# Patient Record
Sex: Female | Born: 1985 | Race: White | Hispanic: No | Marital: Single | Smoking: Current every day smoker
Health system: Southern US, Community
[De-identification: ages and names within clinical notes are randomized; demographics above are authoritative.]

## PROBLEM LIST (undated history)

## (undated) DIAGNOSIS — E039 Hypothyroidism, unspecified: Secondary | ICD-10-CM

## (undated) HISTORY — PX: OTHER SURGICAL HISTORY: SHX169

## (undated) HISTORY — PX: HIP SURGERY: SHX245

## (undated) HISTORY — PX: ADENOIDECTOMY AND MYRINGOTOMY WITH TUBE PLACEMENT: SHX5714

## (undated) HISTORY — PX: KNEE SURGERY: SHX244

---
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
## 2006-05-19 ENCOUNTER — Emergency Department (HOSPITAL_COMMUNITY): Admission: EM | Admit: 2006-05-19 | Discharge: 2006-05-20 | Payer: Self-pay | Admitting: Emergency Medicine

## 2006-08-09 ENCOUNTER — Emergency Department (HOSPITAL_COMMUNITY): Admission: EM | Admit: 2006-08-09 | Discharge: 2006-08-09 | Payer: Self-pay | Admitting: Emergency Medicine

## 2006-11-16 ENCOUNTER — Emergency Department (HOSPITAL_COMMUNITY): Admission: EM | Admit: 2006-11-16 | Discharge: 2006-11-16 | Payer: Self-pay | Admitting: Emergency Medicine

## 2006-11-23 ENCOUNTER — Emergency Department (HOSPITAL_COMMUNITY): Admission: EM | Admit: 2006-11-23 | Discharge: 2006-11-23 | Payer: Self-pay | Admitting: Emergency Medicine

## 2007-02-22 ENCOUNTER — Emergency Department (HOSPITAL_COMMUNITY): Admission: EM | Admit: 2007-02-22 | Discharge: 2007-02-23 | Payer: Self-pay | Admitting: Emergency Medicine

## 2007-04-26 ENCOUNTER — Emergency Department (HOSPITAL_COMMUNITY): Admission: EM | Admit: 2007-04-26 | Discharge: 2007-04-27 | Payer: Self-pay | Admitting: Emergency Medicine

## 2007-05-25 ENCOUNTER — Emergency Department (HOSPITAL_COMMUNITY): Admission: EM | Admit: 2007-05-25 | Discharge: 2007-05-25 | Payer: Self-pay | Admitting: Emergency Medicine

## 2008-06-30 ENCOUNTER — Emergency Department (HOSPITAL_COMMUNITY): Admission: EM | Admit: 2008-06-30 | Discharge: 2008-06-30 | Payer: Self-pay | Admitting: Emergency Medicine

## 2009-02-17 ENCOUNTER — Emergency Department (HOSPITAL_COMMUNITY): Admission: EM | Admit: 2009-02-17 | Discharge: 2009-02-18 | Payer: Self-pay | Admitting: Emergency Medicine

## 2011-02-24 ENCOUNTER — Emergency Department (HOSPITAL_COMMUNITY)
Admission: EM | Admit: 2011-02-24 | Discharge: 2011-02-25 | Disposition: A | Discharge status: Home / Self Care | Payer: Self-pay | Attending: Emergency Medicine | Admitting: Emergency Medicine

## 2011-02-24 DIAGNOSIS — R197 Diarrhea, unspecified: Secondary | ICD-10-CM | POA: Insufficient documentation

## 2011-02-24 DIAGNOSIS — E860 Dehydration: Secondary | ICD-10-CM | POA: Insufficient documentation

## 2011-02-24 DIAGNOSIS — K5289 Other specified noninfective gastroenteritis and colitis: Secondary | ICD-10-CM | POA: Insufficient documentation

## 2011-02-24 DIAGNOSIS — R112 Nausea with vomiting, unspecified: Secondary | ICD-10-CM | POA: Insufficient documentation

## 2011-02-24 LAB — BASIC METABOLIC PANEL
BUN: 13 mg/dL (ref 6–23)
CO2: 22 mEq/L (ref 19–32)
Calcium: 9.6 mg/dL (ref 8.4–10.5)
Chloride: 102 mEq/L (ref 96–112)
Creatinine, Ser: 0.89 mg/dL (ref 0.4–1.2)
GFR calc Af Amer: >60 mL/min (ref >60)
GFR calc non Af Amer: >60 mL/min (ref >60)
Glucose, Bld: 139 mg/dL — ABNORMAL HIGH (ref 70–99)
Potassium: 3.9 mEq/L (ref 3.5–5.1)
Sodium: 137 mEq/L (ref 135–145)

## 2011-02-24 LAB — DIFFERENTIAL
Basophils Relative: 0 % (ref 0–1)
Lymphs Abs: 0.5 10*3/uL — ABNORMAL LOW (ref 0.7–4.0)
Monocytes Relative: 2 % — ABNORMAL LOW (ref 3–12)
Neutro Abs: 14.3 10*3/uL — ABNORMAL HIGH (ref 1.7–7.7)
Neutrophils Relative %: 95 % — ABNORMAL HIGH (ref 43–77)

## 2011-02-24 LAB — CBC
Hemoglobin: 13.4 g/dL (ref 12.0–15.0)
MCH: 27.3 pg (ref 26.0–34.0)
RBC: 4.91 MIL/uL (ref 3.87–5.11)
WBC: 15.1 10*3/uL — ABNORMAL HIGH (ref 4.0–10.5)

## 2011-05-17 NOTE — Op Note (Signed)
NAME:  Michele Fowler, Michele Fowler                         ACCOUNT NO.:  [id number]   MEDICAL RECORD NO.:  [id number]                   PATIENT TYPE:  OIB   LOCATION:  2550                                 FACILITY:  [facility]   PHYSICIAN:  Burnard Bunting, M.D.                 DATE OF BIRTH:  1986-04-09   DATE OF PROCEDURE:  09/13/2003  DATE OF DISCHARGE:                                 OPERATIVE REPORT   PREOPERATIVE DIAGNOSIS:  Right knee anterior cruciate ligament tear.   POSTOPERATIVE DIAGNOSIS:  Right knee anterior cruciate ligament tear and  lateral meniscal tears.   PROCEDURE:  Right knee anterior cruciate ligament reconstruction, bone-  patellar-tendon-bone and partial lateral meniscectomy.   SURGEON ATTENDING:  Burnard Bunting, M.D.   ASSISTANT:  Jerolyn Shin. Tresa Res, M.D.   ANESTHESIA:  General anesthesia plus preop femoral nerve block plus postop  intra-articular injection of clonidine, Marcaine and morphine.   OPERATIVE FINDINGS:  1. Examination under anesthesia:  Range of motion -- full extension and 130     degrees of flexion with 3 to 4 mm of opening to varus and valgus stress     at 30 degrees of flexion and about 2 mm of opening to varus and valgus     stress at 0 degrees of flexion, positive pivot shift and Lachman, PCL     intact, no posterolateral rotatory instability was noted.  2. Diagnostic and operative arthroscopy:  #1 - Intact patellofemoral joint.     #2 - Intact medial compartment articular cartilage with partial-thickness     tear, posterior horn of the medial meniscus, which was stable and less     than approximately 5 mm in length.  #3 - Torn ACL.  #4 - Intact PCL.  #5     - Intact articular cartilage of lateral compartment with partial-     thickness tear of the lateral meniscus in the red right junction over     about a 1-cm area at the level of the popliteus.   PROCEDURE IN DETAIL:  The patient was brought to the operating room where  general endotracheal  anesthesia was induced.  Preoperative IV antibiotics  were administered.  Right leg was examined under anesthesia and then prepped  with Duraprep solution and draped in a sterile manner.  A proximal thigh  tourniquet was placed.  The right leg was then prepped with Duraprep  solution and draped in a sterile manner.  Collier Flowers was used to cover the  operative field.  The leg was elevated and exsanguinated with the Esmarch  wrap.  The tourniquet was inflated.  An incision was made from the inferior  pole of the patella down into the tibial tubercle.  Skin and subcutaneous  tissue were sharply divided.  The peritenon was identified and divided first  as a separate layer for layered closure.  The bone-patellar-tendon-bone  block was harvested using  a double-wide 9-mm knife.  An oscillating saw was  used to harvest the bone plugs.  One #5 Ethibond was placed through the nose  of what should be the femoral bone plug.  Two #5 FiberWires were placed  through what would be the tibial bone plug.  Following harvest of the bone  plug, the patellar defect was bone-grafted.  The patellar tendon was loosely  reapproximated using 0 Vicryl suture.  Peritenon was partially closed from  proximal to halfway distal using 0 Vicryl suture.  At this time, the  periosteal elevator was used to elevate tissue off the proximal and medial  tibia where the anticipated location of the tibial tunnel would begin.  The  anterior inferolateral portal was created and anterior inferomedial portal  was then created under direct visualization.  Diagnostic arthroscopy was  performed.  The suprapatellar pouch was intact.  The patellofemoral  compartment was intact, no loose bodies in the mediolateral gutter.  Medial  compartment articular cartilage was intact.  Small partial-thickness tear of  the medial meniscus posterior horn was encountered; this was stable and did  not require debridement.  ACL was not intact, it was torn;  debridement was  performed of the intercondylar notch region and the over-the-top position  was identified.  The lateral compartment was inspected.  Partial-thickness  tear of the lateral meniscus at the level of the popliteus was identified;  this was stable and did not require debridement, however, there was an  unstable flap of meniscus emanating off the posterior horn of the lateral  meniscus which did require debridement with a shaver.  At this time, the  tibial guide was placed, set at 50 degrees.  A guidepin was then placed  through the posterior aspect of the native ACL footprint in the region of  the central artery of the ACL.  The guidepin was placed at a 60 degree  coronal angle.  A 9-mm reamer was then used to drill the tunnel.  A 2-mm off-  set guide was then placed at the junction of the roof and lateral wall of  the posterolateral femoral condyle.  The Beath pin was then placed.  The 9-  mm reamer was then taken to about 28 mm.  The graft was then passed through  the Beath pin into the knee.  The femoral fixation was achieved with a 7 x  28-mm bioabsorbable interference screw.  He was taken through a range of  motion and isometry was found to be good.  There was no graft impingement.  At this time, the tibial side was secured by tying the graft over a 6.5 x  25.0-mm cancellous screw.  Good graft tension was achieved.  The graft was  tied in about 15 degrees of flexion.  Again, there was no impingement, full  range of motion and stable graft following fixation.  At this time, the  tourniquet was released.  Total tourniquet time was 2 hours.  Thorough  irrigation of the joint was performed.  The graft tension was excellent  after taking the knee through a range of motion.  The portals were then  closed using 0 Vicryl suture.  Peritenon was closed using 0 Vicryl  interrupted simple sutures.  Skin and subcutaneous tissue were closed using interrupted and inverted 2-0 Vicryl  sutures followed by a running 3-0 pull-  out Prolene.  Steri-Strips were applied, bulky dressing, cold pack and knee  immobilizer were applied.  The patient tolerated the procedure well  without  immediate complications.                                               Burnard Bunting, M.D.    GSD/MEDQ  D:  09/13/2003  T:  09/13/2003  Job:  [id number]

## 2011-09-26 LAB — URINE MICROSCOPIC-ADD ON

## 2011-09-26 LAB — URINALYSIS, ROUTINE W REFLEX MICROSCOPIC
Bilirubin Urine: NEGATIVE
Nitrite: NEGATIVE
Specific Gravity, Urine: >1.03 — ABNORMAL HIGH
pH: 5

## 2011-09-26 LAB — PREGNANCY, URINE: Preg Test, Ur: NEGATIVE

## 2012-08-17 DIAGNOSIS — M25559 Pain in unspecified hip: Secondary | ICD-10-CM | POA: Insufficient documentation

## 2012-08-18 ENCOUNTER — Emergency Department (HOSPITAL_COMMUNITY)
Admission: EM | Admit: 2012-08-18 | Discharge: 2012-08-18 | Disposition: A | Discharge status: Home / Self Care | Payer: Self-pay | Attending: Emergency Medicine | Admitting: Emergency Medicine

## 2012-08-18 ENCOUNTER — Encounter (HOSPITAL_COMMUNITY): Payer: Self-pay

## 2012-08-18 ENCOUNTER — Emergency Department (HOSPITAL_COMMUNITY): Payer: Self-pay

## 2012-08-18 DIAGNOSIS — M25559 Pain in unspecified hip: Secondary | ICD-10-CM

## 2012-08-18 MED ORDER — HYDROCODONE-ACETAMINOPHEN 5-325 MG PO TABS: 1.0000 | ORAL_TABLET | ORAL | Status: AC | PRN

## 2012-08-18 MED ORDER — HYDROCODONE-ACETAMINOPHEN 5-325 MG PO TABS
1.0000 | ORAL_TABLET | Freq: Once | ORAL | Status: AC
  Administered 2012-08-18: 1 via ORAL
  Filled 2012-08-18: qty 1

## 2012-08-18 MED ORDER — IBUPROFEN 800 MG PO TABS
800.0000 mg | ORAL_TABLET | Freq: Once | ORAL | Status: AC
  Administered 2012-08-18: 800 mg via ORAL
  Filled 2012-08-18: qty 1

## 2012-08-18 NOTE — ED Notes (Signed)
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

## 2012-08-18 NOTE — ED Provider Notes (Signed)
History     CSN: [id number]  Arrival date & time 08/17/12  2356   First MD Initiated Contact with Patient 08/18/12 0143      Chief Complaint  Patient presents with  . Hip Pain    (Consider location/radiation/quality/duration/timing/severity/associated sxs/prior treatment) HPI  Michele Fowler is a 26 y.o. 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 Pain is sharp and occurs when walking. It has been present intermittently for several weeks. Tonight she was unable to get comfortable to go to sleep. She has taken occasional ibuprofen without relief.  History reviewed. No pertinent past medical history.  Past Surgical History  Procedure Date  . Hip surgery     No family history on file.  History  Substance Use Topics  . Smoking status: Never Smoker   . Smokeless tobacco: Not on file  . Alcohol Use: No    OB History    Grav Para Term Preterm Abortions TAB SAB Ect Mult Living                  Review of Systems  Constitutional: Negative for fever.       10 Systems reviewed and are negative for acute change except as noted in the HPI.  HENT: Negative for congestion.   Eyes: Negative for discharge and redness.  Respiratory: Negative for cough and shortness of breath.   Cardiovascular: Negative for chest pain.  Gastrointestinal: Negative for vomiting and abdominal pain.  Musculoskeletal: Negative for back pain.       Right hip pain  Skin: Negative for rash.  Neurological: Negative for syncope, numbness and headaches.  Psychiatric/Behavioral:       No behavior change.    Allergies  Codeine  Home Medications   Current Outpatient Rx  Name Route Sig Dispense Refill  . IBUPROFEN 800 MG PO TABS Oral Take 800 mg by mouth every 8 (eight) hours as needed.    Marland Kitchen HYDROCODONE-ACETAMINOPHEN 5-325 MG PO TABS Oral Take 1 tablet by mouth every 4 (four) hours  as needed for pain. 15 tablet 0    BP 104/63  Pulse 83  Temp 98.3 F (36.8 C) (Oral)  Resp 16  Ht 4\' 9"  (1.448 m)  Wt 160 lb (72.576 kg)  BMI 34.62 kg/m2  SpO2 98%  LMP 08/16/2012  Physical Exam  Nursing note and vitals reviewed. Constitutional:       Awake, alert, nontoxic appearance.  HENT:  Head: Atraumatic.  Eyes: Right eye exhibits no discharge. Left eye exhibits no discharge.  Neck: Neck supple.  Pulmonary/Chest: Effort normal. She exhibits no tenderness.  Abdominal: Soft. There is no tenderness. There is no rebound.  Musculoskeletal: Normal range of motion. She exhibits no tenderness.       Baseline ROM, no obvious new focal weakness.FROM at both hips. No deformity, erythema, crepitus. No tenderness with palpation.   Neurological:       Mental status and motor strength appears baseline for patient and situation.  Skin: No rash noted.  Psychiatric: She has a normal mood and affect.    ED Course  Procedures (including critical care time)  Labs Reviewed - No data to display Dg Hip Complete Right  08/18/2012  *RADIOLOGY REPORT*  Clinical Data: Right hip pain.  No injury.  RIGHT HIP - COMPLETE 2+ VIEW  Comparison: 05/05/2005  Findings: Hypoplastic appearance of the left femoral head  with incomplete coverage of both femoral heads consistent with congenital change.  No evidence of acute fracture or subluxation. No focal bone lesion or bone destruction.  Bone cortex and trabecular architecture appear intact.  The visualized pelvis, sacrum, SI joints, and symphysis pubis appear intact.  No significant change since previous study.  IMPRESSION: Probable congenital deformities of the hips.  No evidence of acute fracture or subluxation.   Original Report Authenticated By: Marlon Pel, M.D.      1. Hip pain       MDM  Patient with hip pain and no acute findings on xray. GIven antiinflammatory and analgesic with some relief. Referral to orthopedist.  Pt feels improved  after observation and/or treatment in ED.Pt stable in ED with no significant deterioration in condition.The patient appears reasonably screened and/or stabilized for discharge and I doubt any other medical condition or other [facility] requiring further screening, evaluation, or treatment in the ED at this time prior to discharge.  MDM Reviewed: nursing note and vitals Interpretation: x-ray           Nicoletta Dress. Colon Branch, MD 08/18/12 2121

## 2014-01-23 ENCOUNTER — Emergency Department (HOSPITAL_COMMUNITY): Payer: Self-pay

## 2014-01-23 ENCOUNTER — Emergency Department (HOSPITAL_COMMUNITY)
Admission: EM | Admit: 2014-01-23 | Discharge: 2014-01-23 | Disposition: A | Discharge status: Home / Self Care | Payer: Self-pay | Attending: Emergency Medicine | Admitting: Emergency Medicine

## 2014-01-23 ENCOUNTER — Encounter (HOSPITAL_COMMUNITY): Payer: Self-pay | Admitting: Emergency Medicine

## 2014-01-23 DIAGNOSIS — Y9352 Activity, horseback riding: Secondary | ICD-10-CM | POA: Insufficient documentation

## 2014-01-23 DIAGNOSIS — Z9889 Other specified postprocedural states: Secondary | ICD-10-CM | POA: Insufficient documentation

## 2014-01-23 DIAGNOSIS — S82409A Unspecified fracture of shaft of unspecified fibula, initial encounter for closed fracture: Secondary | ICD-10-CM | POA: Insufficient documentation

## 2014-01-23 DIAGNOSIS — Z79899 Other long term (current) drug therapy: Secondary | ICD-10-CM | POA: Insufficient documentation

## 2014-01-23 DIAGNOSIS — S82301A Unspecified fracture of lower end of right tibia, initial encounter for closed fracture: Secondary | ICD-10-CM

## 2014-01-23 DIAGNOSIS — S82899A Other fracture of unspecified lower leg, initial encounter for closed fracture: Secondary | ICD-10-CM | POA: Insufficient documentation

## 2014-01-23 DIAGNOSIS — S82831A Other fracture of upper and lower end of right fibula, initial encounter for closed fracture: Secondary | ICD-10-CM

## 2014-01-23 MED ORDER — MORPHINE SULFATE 4 MG/ML IJ SOLN
4.0000 mg | Freq: Once | INTRAMUSCULAR | Status: AC
  Administered 2014-01-23: 4 mg via INTRAVENOUS
  Filled 2014-01-23: qty 1

## 2014-01-23 MED ORDER — OXYCODONE-ACETAMINOPHEN 5-325 MG PO TABS: 1.0000 | ORAL_TABLET | ORAL | Status: DC | PRN

## 2014-01-23 NOTE — ED Notes (Signed)
Pt was riding a horse when the horse flipped over on pt landing on right lower leg, pt c/o pain and swelling to right lower leg, positive distal pulse noted, good cap refill, deformity , ice pack applied,

## 2014-01-23 NOTE — ED Provider Notes (Signed)
CSN: [id number]     Arrival date & time 01/23/14  1620 History   First MD Initiated Contact with Patient 01/23/14 1810     Chief Complaint  Patient presents with  . Leg Pain    Patient is a 28 y.o. female presenting with leg pain. The history is provided by the patient.  Leg Pain Location:  Ankle Time since incident:  3 hours Ankle location:  R ankle Pain details:    Quality:  Aching   Radiates to:  Does not radiate   Severity:  Moderate   Onset quality:  Sudden   Timing:  Constant   Progression:  Unchanged Chronicity:  New Relieved by:  Rest Exacerbated by: movement. Associated symptoms: no back pain   pt was riding a horse The horse went up on its hind legs and fell on top of her She was not helmeted She denies Head injury.  No HA.  No LOC.  No neck or back pain.  No cp/sob No abd pain She reports pain in right leg/ankle only   History reviewed. No pertinent past medical history. Past Surgical History  Procedure Laterality Date  . Hip surgery    . Knee surgery     No family history on file. History  Substance Use Topics  . Smoking status: Never Smoker   . Smokeless tobacco: Not on file  . Alcohol Use: No   OB History   Grav Para Term Preterm Abortions TAB SAB Ect Mult Living                 Review of Systems  Respiratory: Negative for shortness of breath.   Cardiovascular: Negative for chest pain.  Gastrointestinal: Negative for vomiting.  Musculoskeletal: Negative for back pain.  Neurological: Negative for weakness.  All other systems reviewed and are negative.    Allergies  Codeine  Home Medications   Current Outpatient Rx  Name  Route  Sig  Dispense  Refill  . levothyroxine (SYNTHROID, LEVOTHROID) 25 MCG tablet   Oral   Take 25 mcg by mouth daily before breakfast.          BP 139/99  Pulse 98  Temp(Src) 98.4 F (36.9 C) (Oral)  Resp 20  Ht _0  (1.448 m)  Wt 165 lb (74.844 kg)  BMI 35.70 kg/m2  SpO2 98%  LMP 12/26/2013 Physical  Exam CONSTITUTIONAL: Well developed/well nourished HEAD: Normocephalic/atraumatic EYES: EOMI/PERRL ENMT: Mucous membranes moist, No evidence of facial/nasal trauma NECK: supple no meningeal signs SPINE:entire spine nontender, No bruising/crepitance/stepoffs noted to spine NEXUS Criteria met (she is not distracted during exam) CV: S1/S2 noted, no murmurs/rubs/gallops noted LUNGS: Lungs are clear to auscultation bilaterally, no apparent distress Chest - no tenderness noted to chest wall ABDOMEN: soft, nontender, no rebound or guarding, no bruising GU:no cva tenderness NEURO: Pt is awake/alert, moves all extremitiesx4, GCS 15.  She is able to move toes on right foot without difficulty EXTREMITIES: pulses normal/equal in both feet.  She has tenderness and deformity to right ankle.  No lacerations are noted.  No right prox fib tenderness.  Pelvis stable.   SKIN: warm, color normal PSYCH: no abnormalities of mood noted  ED Course  Procedures (including critical care time) Labs Review Labs Reviewed - No data to display Imaging Review Dg Tibia/fibula Right  01/23/2014   CLINICAL DATA:  Right lower leg pain after being stepped on by a horse today.  EXAM: RIGHT TIBIA AND FIBULA - 2 VIEW  COMPARISON:  None.  FINDINGS: There is a fracture of the distal fibular shaft and of the medial malleolus of the distal tibia. There is a screw in the proximal tibial metaphysis, probably related to prior ACL repair. There is widening of the medial joint space at the ankle.  IMPRESSION: Fractures of the distal tibia and fibula.   Electronically Signed   By: Rozetta Nunnery M.D.   On: 01/23/2014 17:43   Dg Ankle Complete Right  01/23/2014   CLINICAL DATA:  Leg pain after being stepped on by a horse today.  EXAM: RIGHT ANKLE - COMPLETE 3+ VIEW  COMPARISON:  None.  FINDINGS: There is spiral fracture of the distal fibular shaft 7 cm above the tip of the lateral malleolus with slight lateral displacement of the distal  fragment. There is widening of the distal tibiofibular space suggesting injury to the syndesmosis. There is a transverse slightly distracted fracture of the medial malleolus with anterior displacement of the distal fragment. There is widening of the medial joint space at the ankle.  IMPRESSION: 1. Spiral fracture of the distal fibular shaft. 2. Slightly displaced fracture of the medial malleolus. 3. Widening of the distal tibiofibular space consistent with disruption of the syndesmosis.   Electronically Signed   By: Rozetta Nunnery M.D.   On: 01/23/2014 17:42    EKG Interpretation   None       MDM  No diagnosis found. Nursing notes including past medical history and social history reviewed and considered in documentation xrays reviewed and considered  7:04 PM Dr Aline Brochure has reviewed imaging He requests splint and will see in office tomorrow at 9am She has no other signs of traumatic injury at this time   At discharge, pt ambulatory, denies any new complaints, she is neurovascularly intact after splint placement She will see ortho tomorrow   Sharyon Cable, MD 01/23/14 2051

## 2014-01-23 NOTE — Discharge Instructions (Signed)
Ankle Fracture °A fracture is a break in the bone. A cast or splint is used to protect and keep your injured bone from moving.  °HOME CARE INSTRUCTIONS  °· Use your crutches as directed. °· To lessen the swelling, keep the injured leg elevated while sitting or lying down. °· Apply ice to the injury for 15-20 minutes, 03-04 times per day while awake for 2 days. Put the ice in a plastic bag and place a thin towel between the bag of ice and your cast. °· If you have a plaster or fiberglass cast: °· Do not try to scratch the skin under the cast using sharp or pointed objects. °· Check the skin around the cast every day. You may put lotion on any red or sore areas. °· Keep your cast dry and clean. °· If you have a plaster splint: °· Wear the splint as directed. °· You may loosen the elastic around the splint if your toes become numb, tingle, or turn cold or blue. °· Do not put pressure on any part of your cast or splint; it may break. Rest your cast only on a pillow the first 24 hours until it is fully hardened. °· Your cast or splint can be protected during bathing with a plastic bag. Do not lower the cast or splint into water. °· Take medications as directed by your caregiver. Only take over-the-counter or prescription medicines for pain, discomfort, or fever as directed by your caregiver. °· Do not drive a vehicle until your caregiver specifically tells you it is safe to do so. °· If your caregiver has given you a follow-up appointment, it is very important to keep that appointment. Not keeping the appointment could result in a chronic or permanent injury, pain, and disability. If there is any problem keeping the appointment, you must call back to this facility for assistance. °SEEK IMMEDIATE MEDICAL CARE IF:  °· Your cast gets damaged or breaks. °· You have continued severe pain or more swelling than you did before the cast was put on. °· Your skin or toenails below the injury turn blue or gray, or feel cold or  numb. °· There is a bad smell or new stains and/or purulent (pus like) drainage coming from under the cast. °If you do not have a window in your cast for observing the wound, a discharge or minor bleeding may show up as a stain on the outside of your cast. Report these findings to your caregiver. °MAKE SURE YOU:  °· Understand these instructions. °· Will watch your condition. °· Will get help right away if you are not doing well or get worse. °Document Released: 12/13/2000 Document Revised: 03/09/2012 Document Reviewed: 07/15/2013 °[employer]® Patient Information ©2014 [employer]. ° °Cast or Splint Care °Casts and splints support injured limbs and keep bones from moving while they heal.  °HOME CARE °· Keep the cast or splint uncovered during the drying period. °· A plaster cast can take 24 to 48 hours to dry. °· A fiberglass cast will dry in less than 1 hour. °· Do not rest the cast on anything harder than a pillow for 24 hours. °· Do not put weight on your injured limb. Do not put pressure on the cast. Wait for your doctor's approval. °· Keep the cast or splint dry. °· Cover the cast or splint with a plastic bag during baths or wet weather. °· If you have a cast over your chest and belly (trunk), take sponge baths until the cast   is taken off. °· If your cast gets wet, dry it with a towel or blow dryer. Use the cool setting on the blow dryer. °· Keep your cast or splint clean. Wash a dirty cast with a damp cloth. °· Do not put any objects under your cast or splint. °· Do not scratch the skin under the cast with an object. If itching is a problem, use a blow dryer on a cool setting over the itchy area. °· Do not trim or cut your cast. °· Do not take out the padding from inside your cast. °· Exercise your joints near the cast as told by your doctor. °· Raise (elevate) your injured limb on 1 or 2 pillows for the first 1 to 3 days. °GET HELP IF: °· Your cast or splint cracks. °· Your cast or splint is too tight or too  loose. °· You itch badly under the cast. °· Your cast gets wet or has a soft spot. °· You have a bad smell coming from the cast. °· You get an object stuck under the cast. °· Your skin around the cast becomes red or sore. °· You have new or more pain after the cast is put on. °GET HELP RIGHT AWAY IF: °· You have fluid leaking through the cast. °· You cannot move your fingers or toes. °· Your fingers or toes turn blue or white or are cool, painful, or puffy (swollen). °· You have tingling or lose feeling (numbness) around the injured area. °· You have bad pain or pressure under the cast. °· You have trouble breathing or have shortness of breath. °· You have chest pain. °Document Released: 04/17/2011 Document Revised: 08/18/2013 Document Reviewed: 06/24/2013 °Exit[employer]® Patient Information ©2014 [employer]. ° °

## 2014-01-23 NOTE — ED Notes (Signed)
Patient ambulated to the bathroom with crutches. Had no issues and was comfortable ambulating. JGW

## 2014-01-24 ENCOUNTER — Ambulatory Visit (INDEPENDENT_AMBULATORY_CARE_PROVIDER_SITE_OTHER): Payer: Self-pay | Admitting: Orthopedic Surgery

## 2014-01-24 ENCOUNTER — Encounter: Payer: Self-pay | Admitting: Orthopedic Surgery

## 2014-01-24 VITALS — BP 119/82 | Ht <= 58 in | Wt 160.0 lb

## 2014-01-24 DIAGNOSIS — S82843A Displaced bimalleolar fracture of unspecified lower leg, initial encounter for closed fracture: Secondary | ICD-10-CM

## 2014-01-24 NOTE — Progress Notes (Signed)
Patient ID: Michele Fowler, female   DOB: 1986/04/19, 28 y.o.   MRN: [id number] Chief Complaint  Patient presents with  . Ankle Injury    Right ankle fracture. DOI 01-23-14.    HISTORY: 28 year old female who was thrown from her horse landed on her right leg her horse may have actually stepped on her she sustained a bimalleolar closed right ankle fracture. She was injured on January 25 and seen in the emergency room on the same day. She was splinted and sent to the office after a conversation with the ERP. She complains of sharp throbbing 10 out of 10 constant pain with tingling in the foot and swelling.  Her review of systems is positive for swelling and tingling the other review of systems normal. She's allergic to codeine. She has no major medical problems.  She's had bilateral hip surgery and a right anterior cruciate ligament reconstruction. She's on Synthroid medication. Her family history is positive for arthritis cancer and diabetes  Social history single  Her smoking history is positive for half pack per day she doesn't drink she completed her education through the 11th grade    History reviewed. No pertinent past medical history. Past Surgical History  Procedure Laterality Date  . Hip surgery    . Knee surgery     History reviewed. No pertinent family history.   The past, family history and social history have been reviewed and are recorded in the corresponding sections of epic   BP 119/82  Ht 4\' 9"  (1.448 m)  Wt 160 lb (72.576 kg)  BMI 34.61 kg/m2  LMP 12/26/2013 General appearance is normal, the patient is alert and oriented x3 with normal mood and affect. Her body habitus is endomorphic  She is ambulatory with crutches nonweightbearing. Her upper extremities have normal range of motion stability strength and skin. Pulse temperature normal without edema there is no lymphadenopathy there is normal sensation no pathologic reflexes  The left lower extremity is  normal  The right lower extremity swollen and tender on both medial and lateral sides of the ankle with painful range of motion and unstable fracture pattern is noted on x-ray muscle tone is normal the skin is intact she has good pulses warm foot no obvious sensory deficits and no pathologic reflexes  X-rays show bimalleolar fracture Weber C. type injury with a high fibular fracture approximately half way up the fibula if not just a little below that with a displaced medial malleolus and some widening of the mortise  Impression closed bimalleolar ankle fracture right lower extremity  Recommend open treatment internal fixation with bimalleolar plating  The risks and benefits of the procedure have been described to the patient she accepts those risks and she was offered alternative treatment of casting with a poor result likely.  She will have the open treatment internal fixation of the right ankle on Wednesday, January 28

## 2014-01-25 ENCOUNTER — Encounter (HOSPITAL_COMMUNITY): Payer: Self-pay

## 2014-01-25 ENCOUNTER — Encounter (HOSPITAL_COMMUNITY)
Admission: RE | Admit: 2014-01-25 | Discharge: 2014-01-25 | Disposition: A | Discharge status: Home / Self Care | Payer: Self-pay | Source: Ambulatory Visit | Attending: Orthopedic Surgery | Admitting: Orthopedic Surgery

## 2014-01-25 ENCOUNTER — Encounter (HOSPITAL_COMMUNITY): Payer: Self-pay | Admitting: Pharmacy Technician

## 2014-01-25 DIAGNOSIS — Z01812 Encounter for preprocedural laboratory examination: Secondary | ICD-10-CM | POA: Insufficient documentation

## 2014-01-25 DIAGNOSIS — Z01818 Encounter for other preprocedural examination: Secondary | ICD-10-CM | POA: Insufficient documentation

## 2014-01-25 HISTORY — DX: Hypothyroidism, unspecified: E03.9

## 2014-01-25 LAB — HCG, SERUM, QUALITATIVE: Preg, Serum: NEGATIVE

## 2014-01-25 LAB — HEMOGLOBIN AND HEMATOCRIT, BLOOD
HCT: 40.8 % (ref 36.0–46.0)
HEMOGLOBIN: 13.7 g/dL (ref 12.0–15.0)

## 2014-01-25 NOTE — Patient Instructions (Signed)
Gerlene BurdockSherry N Giancola  01/25/2014   Your procedure is scheduled on:  01/26/2014  [facility] at  1130 AM.  Call this number if you have problems the morning of surgery: [phone]   Remember:   Do not eat food or drink liquids after midnight.   Take these medicines the morning of surgery with A SIP OF WATER: synthroid, oxycodone   Do not wear jewelry, make-up or nail polish.  Do not wear lotions, powders, or perfumes.   Do not shave 48 hours prior to surgery. Men may shave face and neck.  Do not bring valuables to the hospital.  [facility] is not [facility] any belongings or valuables.               Contacts, dentures or bridgework may not be worn into surgery.  Leave suitcase in the car. After surgery it may be brought to your room.  For patients admitted to the hospital, discharge time is determined by your treatment team.               Patients discharged the day of surgery will not be allowed to drive home.  Name and phone number of your driver: family  Special Instructions: Shower using CHG 2 nights before surgery and the night before surgery.  If you shower the day of surgery use CHG.  Use special wash - you have one bottle of CHG for all showers.  You should use approximately 1/3 of the bottle for each shower.   Please read over the following fact sheets that you were given: Pain Booklet, Coughing and Deep Breathing, Surgical Site Infection Prevention, Anesthesia Post-op Instructions and Care and Recovery After Surgery Ankle Fracture A fracture is a break in the bone. A cast or splint is used to protect and keep your injured bone from moving.  HOME CARE INSTRUCTIONS   Use your crutches as directed.  To lessen the swelling, keep the injured leg elevated while sitting or lying down.  Apply ice to the injury for 15-20 minutes, 03-04 times per day while awake for 2 days. Put the ice in a plastic bag and place a thin towel between the bag of ice and your  cast.  If you have a plaster or fiberglass cast:  Do not try to scratch the skin under the cast using sharp or pointed objects.  Check the skin around the cast every day. You may put lotion on any red or sore areas.  Keep your cast dry and clean.  If you have a plaster splint:  Wear the splint as directed.  You may loosen the elastic around the splint if your toes become numb, tingle, or turn cold or blue.  Do not put pressure on any part of your cast or splint; it may break. Rest your cast only on a pillow the first 24 hours until it is fully hardened.  Your cast or splint can be protected during bathing with a plastic bag. Do not lower the cast or splint into water.  Take medications as directed by your caregiver. Only take over-the-counter or prescription medicines for pain, discomfort, or fever as directed by your caregiver.  Do not drive a vehicle until your caregiver specifically tells you it is safe to do so.  If your caregiver has given you a follow-up appointment, it is very important to keep that appointment. Not keeping the appointment could result in a chronic or permanent injury, pain, and disability.  If there is any problem keeping the appointment, you must call back to this facility for assistance. SEEK IMMEDIATE MEDICAL CARE IF:   Your cast gets damaged or breaks.  You have continued severe pain or more swelling than you did before the cast was put on.  Your skin or toenails below the injury turn blue or gray, or feel cold or numb.  There is a bad smell or new stains and/or purulent (pus like) drainage coming from under the cast. If you do not have a window in your cast for observing the wound, a discharge or minor bleeding may show up as a stain on the outside of your cast. Report these findings to your caregiver. MAKE SURE YOU:   Understand these instructions.  Will watch your condition.  Will get help right away if you are not doing well or get  worse. Document Released: 12/13/2000 Document Revised: 03/09/2012 Document Reviewed: 07/15/2013 [facility] Patient Information 2014 [place], [place]. PATIENT INSTRUCTIONS POST-ANESTHESIA  IMMEDIATELY FOLLOWING SURGERY:  Do not drive or operate machinery for the first twenty four hours after surgery.  Do not make any important decisions for twenty four hours after surgery or while taking narcotic pain medications or sedatives.  If you develop intractable nausea and vomiting or a severe headache please notify your doctor immediately.  FOLLOW-UP:  Please make an appointment with your surgeon as instructed. You do not need to follow up with anesthesia unless specifically instructed to do so.  WOUND CARE INSTRUCTIONS (if applicable):  Keep a dry clean dressing on the anesthesia/puncture wound site if there is drainage.  Once the wound has quit draining you may leave it open to air.  Generally you should leave the bandage intact for twenty four hours unless there is drainage.  If the epidural site drains for more than 36-48 hours please call the anesthesia department.  QUESTIONS?:  Please feel free to call your physician or the hospital operator if you have any questions, and they will be happy to assist you.

## 2014-01-26 ENCOUNTER — Ambulatory Visit (HOSPITAL_COMMUNITY): Payer: Self-pay | Admitting: Anesthesiology

## 2014-01-26 ENCOUNTER — Ambulatory Visit (HOSPITAL_COMMUNITY)
Admission: RE | Admit: 2014-01-26 | Discharge: 2014-01-26 | Disposition: A | Discharge status: Home / Self Care | Payer: Self-pay | Source: Ambulatory Visit | Attending: Orthopedic Surgery | Admitting: Orthopedic Surgery

## 2014-01-26 ENCOUNTER — Encounter (HOSPITAL_COMMUNITY)
Admission: RE | Disposition: A | Discharge status: Home / Self Care | Payer: Self-pay | Source: Ambulatory Visit | Attending: Orthopedic Surgery

## 2014-01-26 ENCOUNTER — Encounter (HOSPITAL_COMMUNITY): Payer: Self-pay | Admitting: *Deleted

## 2014-01-26 ENCOUNTER — Ambulatory Visit (HOSPITAL_COMMUNITY): Payer: Self-pay

## 2014-01-26 ENCOUNTER — Encounter (HOSPITAL_COMMUNITY): Payer: Self-pay | Admitting: Anesthesiology

## 2014-01-26 DIAGNOSIS — S82843A Displaced bimalleolar fracture of unspecified lower leg, initial encounter for closed fracture: Secondary | ICD-10-CM | POA: Insufficient documentation

## 2014-01-26 HISTORY — PX: ORIF ANKLE FRACTURE: SHX5408

## 2014-01-26 SURGERY — OPEN REDUCTION INTERNAL FIXATION (ORIF) ANKLE FRACTURE
Anesthesia: General | Site: Ankle | Laterality: Right

## 2014-01-26 MED ORDER — CEFAZOLIN SODIUM-DEXTROSE 2-3 GM-% IV SOLR
2.0000 g | INTRAVENOUS | Status: AC
  Administered 2014-01-26: 2 g via INTRAVENOUS

## 2014-01-26 MED ORDER — ROCURONIUM BROMIDE 50 MG/5ML IV SOLN
INTRAVENOUS | Status: AC
  Filled 2014-01-26: qty 1

## 2014-01-26 MED ORDER — CHLORHEXIDINE GLUCONATE 4 % EX LIQD: 60.0000 mL | Freq: Once | CUTANEOUS | Status: DC

## 2014-01-26 MED ORDER — DIPHENHYDRAMINE HCL 50 MG/ML IJ SOLN
INTRAMUSCULAR | Status: AC
  Filled 2014-01-26: qty 1

## 2014-01-26 MED ORDER — BUPIVACAINE-EPINEPHRINE PF 0.5-1:200000 % IJ SOLN
INTRAMUSCULAR | Status: AC
  Filled 2014-01-26: qty 20

## 2014-01-26 MED ORDER — GLYCOPYRROLATE 0.2 MG/ML IJ SOLN
INTRAMUSCULAR | Status: DC | PRN
  Administered 2014-01-26: 0.6 mg via INTRAVENOUS

## 2014-01-26 MED ORDER — PROMETHAZINE HCL 12.5 MG PO TABS: 12.5000 mg | ORAL_TABLET | Freq: Four times a day (QID) | ORAL | Status: DC | PRN

## 2014-01-26 MED ORDER — BUPIVACAINE-EPINEPHRINE PF 0.5-1:200000 % IJ SOLN
INTRAMUSCULAR | Status: DC | PRN
  Administered 2014-01-26: 60 mL

## 2014-01-26 MED ORDER — ONDANSETRON HCL 4 MG/2ML IJ SOLN
4.0000 mg | Freq: Once | INTRAMUSCULAR | Status: AC | PRN
  Administered 2014-01-26: 4 mg via INTRAVENOUS

## 2014-01-26 MED ORDER — LACTATED RINGERS IV SOLN
INTRAVENOUS | Status: DC
  Administered 2014-01-26: 12:00:00 via INTRAVENOUS

## 2014-01-26 MED ORDER — PROPOFOL 10 MG/ML IV BOLUS
INTRAVENOUS | Status: DC | PRN
  Administered 2014-01-26: 150 mg via INTRAVENOUS

## 2014-01-26 MED ORDER — ONDANSETRON HCL 4 MG/2ML IJ SOLN
INTRAMUSCULAR | Status: AC
  Filled 2014-01-26: qty 2

## 2014-01-26 MED ORDER — FENTANYL CITRATE 0.05 MG/ML IJ SOLN
INTRAMUSCULAR | Status: AC
  Filled 2014-01-26: qty 2

## 2014-01-26 MED ORDER — NEOSTIGMINE METHYLSULFATE 1 MG/ML IJ SOLN
INTRAMUSCULAR | Status: DC | PRN
  Administered 2014-01-26: 4 mg via INTRAVENOUS

## 2014-01-26 MED ORDER — KETOROLAC TROMETHAMINE 30 MG/ML IJ SOLN
30.0000 mg | Freq: Once | INTRAMUSCULAR | Status: AC
  Administered 2014-01-26: 30 mg via INTRAVENOUS
  Filled 2014-01-26: qty 1

## 2014-01-26 MED ORDER — CEFAZOLIN SODIUM-DEXTROSE 2-3 GM-% IV SOLR
INTRAVENOUS | Status: AC
  Filled 2014-01-26: qty 50

## 2014-01-26 MED ORDER — ONDANSETRON HCL 4 MG/2ML IJ SOLN
4.0000 mg | Freq: Once | INTRAMUSCULAR | Status: DC
  Filled 2014-01-26: qty 2

## 2014-01-26 MED ORDER — SODIUM CHLORIDE 0.9 % IR SOLN
Status: DC | PRN
  Administered 2014-01-26 (×2): 1000 mL

## 2014-01-26 MED ORDER — MIDAZOLAM HCL 2 MG/2ML IJ SOLN
1.0000 mg | INTRAMUSCULAR | Status: DC | PRN
  Administered 2014-01-26: 2 mg via INTRAVENOUS

## 2014-01-26 MED ORDER — OXYCODONE-ACETAMINOPHEN 5-325 MG PO TABS: 1.0000 | ORAL_TABLET | ORAL | Status: DC | PRN

## 2014-01-26 MED ORDER — LACTATED RINGERS IV SOLN
INTRAVENOUS | Status: DC | PRN
  Administered 2014-01-26 (×2): via INTRAVENOUS

## 2014-01-26 MED ORDER — MIDAZOLAM HCL 2 MG/2ML IJ SOLN
INTRAMUSCULAR | Status: AC
  Filled 2014-01-26: qty 2

## 2014-01-26 MED ORDER — FENTANYL CITRATE 0.05 MG/ML IJ SOLN
INTRAMUSCULAR | Status: DC | PRN
  Administered 2014-01-26: 100 ug via INTRAVENOUS
  Administered 2014-01-26 (×4): 50 ug via INTRAVENOUS
  Administered 2014-01-26: 100 ug via INTRAVENOUS
  Administered 2014-01-26 (×5): 50 ug via INTRAVENOUS

## 2014-01-26 MED ORDER — GLYCOPYRROLATE 0.2 MG/ML IJ SOLN
INTRAMUSCULAR | Status: AC
  Filled 2014-01-26: qty 3

## 2014-01-26 MED ORDER — FENTANYL CITRATE 0.05 MG/ML IJ SOLN
25.0000 ug | INTRAMUSCULAR | Status: DC | PRN
  Administered 2014-01-26 (×4): 50 ug via INTRAVENOUS

## 2014-01-26 MED ORDER — ONDANSETRON HCL 4 MG/2ML IJ SOLN
4.0000 mg | Freq: Once | INTRAMUSCULAR | Status: AC
  Administered 2014-01-26: 4 mg via INTRAVENOUS

## 2014-01-26 MED ORDER — ROCURONIUM BROMIDE 100 MG/10ML IV SOLN
INTRAVENOUS | Status: DC | PRN
  Administered 2014-01-26: 10 mg via INTRAVENOUS
  Administered 2014-01-26: 35 mg via INTRAVENOUS

## 2014-01-26 MED ORDER — MIDAZOLAM HCL 5 MG/5ML IJ SOLN
INTRAMUSCULAR | Status: DC | PRN
  Administered 2014-01-26: 2 mg via INTRAVENOUS

## 2014-01-26 MED ORDER — LIDOCAINE HCL (CARDIAC) 20 MG/ML IV SOLN
INTRAVENOUS | Status: DC | PRN
  Administered 2014-01-26: 50 mg via INTRAVENOUS

## 2014-01-26 MED ORDER — DIPHENHYDRAMINE HCL 50 MG/ML IJ SOLN
25.0000 mg | Freq: Once | INTRAMUSCULAR | Status: AC
  Administered 2014-01-26: 25 mg via INTRAVENOUS

## 2014-01-26 MED ORDER — FENTANYL CITRATE 0.05 MG/ML IJ SOLN
INTRAMUSCULAR | Status: AC
  Filled 2014-01-26: qty 5

## 2014-01-26 MED ORDER — FENTANYL CITRATE 0.05 MG/ML IJ SOLN
25.0000 ug | INTRAMUSCULAR | Status: AC
  Administered 2014-01-26 (×2): 25 ug via INTRAVENOUS

## 2014-01-26 MED ORDER — OXYCODONE HCL 5 MG PO TABS
5.0000 mg | ORAL_TABLET | Freq: Once | ORAL | Status: AC
  Administered 2014-01-26: 5 mg via ORAL
  Filled 2014-01-26: qty 1

## 2014-01-26 SURGICAL SUPPLY — 58 items
BAG HAMPER (MISCELLANEOUS) ×3 IMPLANT
BANDAGE ELASTIC 4 VELCRO NS (GAUZE/BANDAGES/DRESSINGS) ×4 IMPLANT
BANDAGE ESMARK 4X12 BL STRL LF (DISPOSABLE) ×1 IMPLANT
BIT DRILL 2.8 (BIT) ×1
BIT DRILL CANN QC 2.8X165 (BIT) IMPLANT
BLADE SURG 15 STRL LF DISP TIS (BLADE) IMPLANT
BLADE SURG 15 STRL SS (BLADE) ×6
BLADE SURG SZ10 CARB STEEL (BLADE) ×3 IMPLANT
BNDG CMPR 12X4 ELC STRL LF (DISPOSABLE) ×1
BNDG COHESIVE 4X5 TAN STRL (GAUZE/BANDAGES/DRESSINGS) ×3 IMPLANT
BNDG ESMARK 4X12 BLUE STRL LF (DISPOSABLE) ×3
CHLORAPREP W/TINT 26ML (MISCELLANEOUS) ×6 IMPLANT
CLOTH BEACON ORANGE TIMEOUT ST (SAFETY) ×3 IMPLANT
COVER LIGHT HANDLE STERIS (MISCELLANEOUS) ×8 IMPLANT
CUFF TOURNIQUET SINGLE 34IN LL (TOURNIQUET CUFF) ×3 IMPLANT
DRAPE C-ARM FOLDED MOBILE STRL (DRAPES) ×3 IMPLANT
DRILL BIT 2.8MM (BIT) ×3
GAUZE XEROFORM 5X9 LF (GAUZE/BANDAGES/DRESSINGS) ×3 IMPLANT
GLOVE BIOGEL PI IND STRL 7.0 (GLOVE) IMPLANT
GLOVE BIOGEL PI INDICATOR 7.0 (GLOVE) ×6
GLOVE ECLIPSE 6.5 STRL STRAW (GLOVE) ×2 IMPLANT
GLOVE ECLIPSE 7.0 STRL STRAW (GLOVE) ×2 IMPLANT
GLOVE EXAM NITRILE PF LG BLUE (GLOVE) ×2 IMPLANT
GLOVE EXAM NITRILE PF MED BLUE (GLOVE) ×4 IMPLANT
GLOVE SKINSENSE NS SZ8.0 LF (GLOVE) ×2
GLOVE SKINSENSE STRL SZ8.0 LF (GLOVE) ×1 IMPLANT
GLOVE SS N UNI LF 8.5 STRL (GLOVE) ×5 IMPLANT
GOWN STRL REUS W/TWL LRG LVL3 (GOWN DISPOSABLE) ×9 IMPLANT
GOWN STRL REUS W/TWL XL LVL3 (GOWN DISPOSABLE) ×3 IMPLANT
INST SET MINOR BONE (KITS) ×3 IMPLANT
K-WIRE FX150X1.25XNS LF SS (Wire) ×2 IMPLANT
K-WIRE SMOOTH (Wire) ×6 IMPLANT
KIT ROOM TURNOVER APOR (KITS) ×3 IMPLANT
KWIRE FX150X1.25XNS LF SS (Wire) IMPLANT
MANIFOLD NEPTUNE II (INSTRUMENTS) ×3 IMPLANT
NDL HYPO 21X1.5 SAFETY (NEEDLE) ×1 IMPLANT
NEEDLE HYPO 21X1.5 SAFETY (NEEDLE) ×3 IMPLANT
NS IRRIG 1000ML POUR BTL (IV SOLUTION) ×5 IMPLANT
PACK BASIC LIMB (CUSTOM PROCEDURE TRAY) ×3 IMPLANT
PAD ABD 5X9 TENDERSORB (GAUZE/BANDAGES/DRESSINGS) ×6 IMPLANT
PAD CAST 4YDX4 CTTN HI CHSV (CAST SUPPLIES) ×1 IMPLANT
PADDING CAST COTTON 4X4 STRL (CAST SUPPLIES) ×3
PLATE 7H 96MM (Plate) ×2 IMPLANT
SCREW 3.5X10MM (Screw) ×8 IMPLANT
SCREW BONE 14MMX3.5MM (Screw) ×2 IMPLANT
SCREW BONE NON-LCKING 3.5X12MM (Screw) ×4 IMPLANT
SET BASIN LINEN APH (SET/KITS/TRAYS/PACK) ×5 IMPLANT
SPLINT J IMMOBILIZER 4X20FT (CAST SUPPLIES) ×1 IMPLANT
SPLINT J PLASTER J 4INX20Y (CAST SUPPLIES) ×2
SPONGE GAUZE 4X4 12PLY (GAUZE/BANDAGES/DRESSINGS) ×5 IMPLANT
SPONGE LAP 18X18 X RAY DECT (DISPOSABLE) ×5 IMPLANT
STAPLER VISISTAT 35W (STAPLE) ×3 IMPLANT
SUT ETHILON 3 0 FSL (SUTURE) ×2 IMPLANT
SUT MON AB 0 CT1 (SUTURE) ×1 IMPLANT
SUT MON AB 2-0 CT1 36 (SUTURE) ×4 IMPLANT
SYR 30ML LL (SYRINGE) ×3 IMPLANT
SYR BULB IRRIGATION 50ML (SYRINGE) ×3 IMPLANT
WATER STERILE IRR 1000ML POUR (IV SOLUTION) ×6 IMPLANT

## 2014-01-26 NOTE — Transfer of Care (Signed)
Immediate Anesthesia Transfer of Care Note  Patient: Michele BurdockSherry N Volkman  Procedure(s) Performed: Procedure(s): OPEN TREATMENT INTERNAL FIXATION RIGHT ANKLE FRACTURE (Right)  Patient Location: PACU  Anesthesia Type:General  Level of Consciousness: sedated and patient cooperative  Airway & Oxygen Therapy: Patient Spontanous Breathing and Patient connected to face mask oxygen  Post-op Assessment: Report given to PACU RN and Post -op Vital signs reviewed and stable  Post vital signs: Reviewed and stable  Complications: No apparent anesthesia complications

## 2014-01-26 NOTE — Brief Op Note (Addendum)
01/26/2014  3:02 PM  PATIENT:  Michele Fowler  28 y.o. female  PRE-OPERATIVE DIAGNOSIS:  Closed fracture bimalleolar right ankle  POST-OPERATIVE DIAGNOSIS:  Closed fracture bimalleolar right ankle  PROCEDURE:  Procedure(s): OPEN TREATMENT INTERNAL FIXATION RIGHT ANKLE FRACTURE (Right)  Stryker ankle implants   SURGEON:  Surgeon(s) and Role:    * Breydan Shillingburg E Revonda Menter, MD - Primary  PHYSICIAN ASSISTANT:   ASSISTANTS: debbi dallas    ANESTHESIA:   general  EBL:  Total I/O In: 1000 [I.V.:1000] Out: 10 [Blood:10]  BLOOD ADMINISTERED:none  DRAINS: none   LOCAL MEDICATIONS USED:  MARCAINE     SPECIMEN:  No Specimen  DISPOSITION OF SPECIMEN:  N/A  COUNTS:  YES  TOURNIQUET:   Total Tourniquet Time Documented: Thigh (Right) - 72 minutes Total: Thigh (Right) - 72 minutes   DICTATION: .Dragon Dictation  PLAN OF CARE: Discharge to home after PACU  PATIENT DISPOSITION:  PACU - hemodynamically stable.   Delay start of Pharmacological VTE agent (>24hrs) due to surgical blood loss or risk of bleeding: not applicable  Details of procedure The surgical site was confirmed and marked as right ankle in the preop area  The patient taken to the surgical suite and general anesthesia was administered. A tourniquet was used on the right thigh. Sterile prep and drape was performed DuraPrep. The limb was exsanguinated by elevation of for 5 minutes a tourniquet was inflated to 300 millimeters of mercury. A lateral approach to the distal fibula was performed. Skin incision was made over the fibula. Full-thickness skin flaps were carried down to bone. Subperiosteal elevation was done to expose the fracture. The fracture was reduced and held with a bone clamp, radiographs confirmed reduction. A lateral 7-hole plate was applied using AO technique and nonlocking mode. Radiographs confirm reduction of the mortise.  A longitudinal incision was made over the medial malleolus again subcutaneous  tissue was taken down to bone with full-thickness flaps. A hole was drilled in the tibia and a pointed reduction clamp was used to hold the medial malleolus reduced. Prior to reduction the joint was irrigated and soft tissue debridement was performed to aid in fracture reduction  2 K wires were placed in parallel fashion.  The K wires were cut and tapped into the bone  Final x-rays confirmed fracture reduction and mortise was reduced.  The wounds were injected with Marcaine with epinephrine. The medial side was closed with 3-0 nylon suture in interrupted fashion. The lateral side was closed with 2-0 Monocryl and staples   Sterile dressings were applied.  Posterior splint was applied 

## 2014-01-26 NOTE — Anesthesia Preprocedure Evaluation (Signed)
Anesthesia Evaluation  Patient identified by MRN, date of birth, ID band Patient awake    Reviewed: Allergy & Precautions, H&P , NPO status , Patient's Chart, lab work & pertinent test results  Airway Mallampati: I TM Distance: >3 FB Neck ROM: Full    Dental  (+) Teeth Intact   Pulmonary Current Smoker,  breath sounds clear to auscultation        Cardiovascular negative cardio ROS  Rhythm:Regular Rate:Normal     Neuro/Psych    GI/Hepatic negative GI ROS,   Endo/Other  Hypothyroidism   Renal/GU      Musculoskeletal   Abdominal   Peds  Hematology   Anesthesia Other Findings   Reproductive/Obstetrics                           Anesthesia Physical Anesthesia Plan  ASA: II  Anesthesia Plan: General   Post-op Pain Management:    Induction: Intravenous  Airway Management Planned: Oral ETT  Additional Equipment:   Intra-op Plan:   Post-operative Plan: Extubation in OR  Informed Consent: I have reviewed the patients History and Physical, chart, labs and discussed the procedure including the risks, benefits and alternatives for the proposed anesthesia with the patient or authorized representative who has indicated his/her understanding and acceptance.     Plan Discussed with:   Anesthesia Plan Comments:         Anesthesia Quick Evaluation

## 2014-01-26 NOTE — Anesthesia Postprocedure Evaluation (Signed)
  Anesthesia Post-op Note  Patient: Michele Fowler  Procedure(s) Performed: Procedure(s): OPEN TREATMENT INTERNAL FIXATION RIGHT ANKLE FRACTURE (Right)  Patient Location: PACU  Anesthesia Type:General  Level of Consciousness: sedated and patient cooperative  Airway and Oxygen Therapy: Patient Spontanous Breathing and Patient connected to face mask oxygen  Post-op Pain: mild  Post-op Assessment: Post-op Vital signs reviewed, Patient's Cardiovascular Status Stable, Respiratory Function Stable, Patent Airway, No signs of Nausea or vomiting and Pain level controlled  Post-op Vital Signs: Reviewed and stable  Complications: No apparent anesthesia complications

## 2014-01-26 NOTE — H&P (Signed)
  Chief Complaint   Patient presents with   .  Ankle Injury       Right ankle fracture. DOI 01-23-14.     HISTORY: 28 year old female who was thrown from her horse landed on her right leg her horse may have actually stepped on her she sustained a bimalleolar closed right ankle fracture. She was injured on January 25 and seen in the emergency room on the same day. She was splinted and sent to the office after a conversation with the ERP. She complains of sharp throbbing 10 out of 10 constant pain with tingling in the foot and swelling.  Her review of systems is positive for swelling and tingling the other review of systems normal. She's allergic to codeine. She has no major medical problems.  She's had bilateral hip surgery and a right anterior cruciate ligament reconstruction. She's on Synthroid medication. Her family history is positive for arthritis cancer and diabetes  Social history single  Her smoking history is positive for half pack per day she doesn't drink she completed her education through the 11th grade    History reviewed. No pertinent past medical history. Past Surgical History   Procedure  Laterality  Date   .  Hip surgery       .  Knee surgery        History reviewed. No pertinent family history.   The past, family history and social history have been reviewed and are recorded in the corresponding sections of epic   BP 119/82  Ht 4\' 9"  (1.448 m)  Wt 160 lb (72.576 kg)  BMI 34.61 kg/m2  LMP 12/26/2013 General appearance is normal, the patient is alert and oriented x3 with normal mood and affect. Her body habitus is endomorphic  She is ambulatory with crutches nonweightbearing. Her upper extremities have normal range of motion stability strength and skin. Pulse temperature normal without edema there is no lymphadenopathy there is normal sensation no pathologic reflexes  The left lower extremity is normal  The right lower extremity swollen and tender on both  medial and lateral sides of the ankle with painful range of motion and unstable fracture pattern is noted on x-ray muscle tone is normal the skin is intact she has good pulses warm foot no obvious sensory deficits and no pathologic reflexes  X-rays show bimalleolar fracture Weber C. type injury with a high fibular fracture approximately half way up the fibula if not just a little below that with a displaced medial malleolus and some widening of the mortise  Impression closed bimalleolar ankle fracture right lower extremity  Recommend open treatment internal fixation with bimalleolar plating  The risks and benefits of the procedure have been described to the patient she accepts those risks and she was offered alternative treatment of casting with a poor result likely.  She will have the open treatment internal fixation of the right ankle on Wednesday, January 28

## 2014-01-26 NOTE — Preoperative (Signed)
Beta Blockers   Reason not to administer Beta Blockers:Not Applicable 

## 2014-01-26 NOTE — OR Nursing (Signed)
Rest of  fentynl  Given to   [employer] Adams CRNA

## 2014-01-26 NOTE — H&P (View-Only) (Signed)
Patient ID: Michele Fowler, female   DOB: 03/11/1986, 27 y.o.   MRN: [id number] Chief Complaint  Patient presents with  . Ankle Injury    Right ankle fracture. DOI 01-23-14.    HISTORY: 27-year-old female who was thrown from her horse landed on her right leg her horse may have actually stepped on her she sustained a bimalleolar closed right ankle fracture. She was injured on January 25 and seen in the emergency room on the same day. She was splinted and sent to the office after a conversation with the ERP. She complains of sharp throbbing 10 out of 10 constant pain with tingling in the foot and swelling.  Her review of systems is positive for swelling and tingling the other review of systems normal. She's allergic to codeine. She has no major medical problems.  She's had bilateral hip surgery and a right anterior cruciate ligament reconstruction. She's on Synthroid medication. Her family history is positive for arthritis cancer and diabetes  Social history single  Her smoking history is positive for half pack per day she doesn't drink she completed her education through the 11th grade    History reviewed. No pertinent past medical history. Past Surgical History  Procedure Laterality Date  . Hip surgery    . Knee surgery     History reviewed. No pertinent family history.   The past, family history and social history have been reviewed and are recorded in the corresponding sections of epic   BP 119/82  Ht 4' 9" (1.448 m)  Wt 160 lb (72.576 kg)  BMI 34.61 kg/m2  LMP 12/26/2013 General appearance is normal, the patient is alert and oriented x3 with normal mood and affect. Her body habitus is endomorphic  She is ambulatory with crutches nonweightbearing. Her upper extremities have normal range of motion stability strength and skin. Pulse temperature normal without edema there is no lymphadenopathy there is normal sensation no pathologic reflexes  The left lower extremity is  normal  The right lower extremity swollen and tender on both medial and lateral sides of the ankle with painful range of motion and unstable fracture pattern is noted on x-ray muscle tone is normal the skin is intact she has good pulses warm foot no obvious sensory deficits and no pathologic reflexes  X-rays show bimalleolar fracture Weber C. type injury with a high fibular fracture approximately half way up the fibula if not just a little below that with a displaced medial malleolus and some widening of the mortise  Impression closed bimalleolar ankle fracture right lower extremity  Recommend open treatment internal fixation with bimalleolar plating  The risks and benefits of the procedure have been described to the patient she accepts those risks and she was offered alternative treatment of casting with a poor result likely.  She will have the open treatment internal fixation of the right ankle on Wednesday, January 28 

## 2014-01-26 NOTE — Discharge Instructions (Signed)
Open Reduction, Internal Fixation (ORIF), Generic Usually, if bones are broken (fractured) and are out of place, unstable, or may become out of place, surgery is needed. This surgery is called an open reduction and internal fixation (ORIF). Open reduction means that the area of the fracture is opened up so the surgeon can see it. Internal fixation means that screws, pins, or fixation devices are used to hold the bone pieces in place. LET YOUR CAREGIVER KNOW ABOUT:   Allergies.  Medicines taken, including herbs, eyedrops, over-the-counter medicines, and creams.  Use of steroids (by mouth or creams).  Previous problems with anesthetics or numbing medicines.  History of bleeding or blood problems.  History of blood clots.  Possibility of pregnancy, if this applies.  Previous surgery.  Other health problems. RISKS AND COMPLICATIONS  All surgery is associated with risks. Some of these risks are:  Excessive bleeding.  Infection.  Imperfect results with loss of joint function. BEFORE THE PROCEDURE  Usually, surgery is performed shortly after the injury. It is important to provide information to your caregiver after your injury. AFTER THE PROCEDURE  After surgery, you will be taken to a recovery area where a nurse will monitor your progress. You may have a long, narrow tube(catheter) in the bladder following surgery that helps you pass your water. When awake, stable, taking fluids well, and without complications, you will be returned to your room. You will receive physical therapy and other care. Physical therapy is done until you are doing well and your caregiver feels it is safe for you to go home or to an extended care facility. Following surgery, the bones may be protected with a cast. The type of casting depends on where the fracture was. Casts are generally left in place for about 5 to 6 weeks. During this time, your caregiver will follow your progress. X-rays may be taken during  healing to make sure the bones stay in place. HOME CARE INSTRUCTIONS   You or your child may resume normal diet and activities as directed or allowed.  Put ice on the injured area.  Put ice in a plastic bag.  Place a towel between the skin and the bag.  Leave the ice on for 15-20 minutes at a time, 03-04 times a day, for the first 2 days following surgery.  Change bandages (dressings) if necessary or as directed.  If given a plaster or fiberglass cast:  Do not try to scratch the skin under the cast using sharp or pointed objects.  Check the skin around the cast every day. You may put lotion on any red or sore areas.  Keep the cast dry and clean.  Do not put pressure on any part of the cast or splint until it is fully hardened.  The cast or splint can be protected during bathing with a plastic bag. Do not lower the cast or splint into water.  Only take over-the-counter or prescription medicines for pain, discomfort, or fever as directed by your caregiver.  Use crutches as directed and do not exercise the leg unless instructed.  If the bones get out of position (displaced), it may eventually lead to arthritis and lasting disability. Problems can follow even the best of care. Follow the directions of your caregiver.  Follow all instructions given by your caregiver, make and keep follow-up appointments, and use crutches as directed. SEEK IMMEDIATE MEDICAL CARE IF:   There is redness, swelling, numbness, or increasing pain in the wound.  There is pus coming  from the wound.  You or your child has an oral temperature above 102 F (38.9 C), not controlled by medicine.  A bad smell is coming from the wound or dressing.  The wound breaks open (edges not staying together) after stitches (sutures) or staples have been removed.  The skin or nails below the injury turn blue or gray, or feel cold or numb.  There is severe pain under the cast or in the foot. If there is not a window  in the cast for observing the wound, a discharge or minor bleeding may show up as a stain on the outside of the cast. Report these findings to your caregiver. MAKE SURE YOU:   Understand these instructions.  Will watch your condition.  Will get help right away if you are not doing well or gets worse. Document Released: 12/27/2006 Document Revised: 03/09/2012 Document Reviewed: 12/04/2007 [facility] Patient Information 2014 [place], [place]. PATIENT INSTRUCTIONS POST-ANESTHESIA  IMMEDIATELY FOLLOWING SURGERY:  Do not drive or operate machinery for the first twenty four hours after surgery.  Do not make any important decisions for twenty four hours after surgery or while taking narcotic pain medications or sedatives.  If you develop intractable nausea and vomiting or a severe headache please notify your doctor immediately.  FOLLOW-UP:  Please make an appointment with your surgeon as instructed. You do not need to follow up with anesthesia unless specifically instructed to do so.  WOUND CARE INSTRUCTIONS (if applicable):  Keep a dry clean dressing on the anesthesia/puncture wound site if there is drainage.  Once the wound has quit draining you may leave it open to air.  Generally you should leave the bandage intact for twenty four hours unless there is drainage.  If the epidural site drains for more than 36-48 hours please call the anesthesia department.  QUESTIONS?:  Please feel free to call your physician or the hospital operator if you have any questions, and they will be happy to assist you.

## 2014-01-26 NOTE — Anesthesia Procedure Notes (Signed)
Procedure Name: Intubation Date/Time: 01/26/2014 1:10 PM Performed by: Pernell DupreADAMS, AMY A Pre-anesthesia Checklist: Patient identified, Patient being monitored, Timeout performed, Emergency Drugs available and Suction available Patient Re-evaluated:Patient Re-evaluated prior to inductionOxygen Delivery Method: Circle System Utilized Preoxygenation: Pre-oxygenation with 100% oxygen Intubation Type: IV induction Ventilation: Mask ventilation without difficulty Laryngoscope Size: 3 and Miller Grade View: Grade I Tube type: Oral Tube size: 7.0 mm Number of attempts: 1 Airway Equipment and Method: stylet Placement Confirmation: ETT inserted through vocal cords under direct vision,  positive ETCO2 and breath sounds checked- equal and bilateral Secured at: 21 cm Tube secured with: Tape Dental Injury: Teeth and Oropharynx as per pre-operative assessment

## 2014-01-26 NOTE — Op Note (Signed)
01/26/2014  3:02 PM  PATIENT:  Michele Fowler  28 y.o. female  PRE-OPERATIVE DIAGNOSIS:  Closed fracture bimalleolar right ankle  POST-OPERATIVE DIAGNOSIS:  Closed fracture bimalleolar right ankle  PROCEDURE:  Procedure(s): OPEN TREATMENT INTERNAL FIXATION RIGHT ANKLE FRACTURE (Right)  Stryker ankle implants   SURGEON:  Surgeon(s) and Role:    * Vickki HearingStanley E Cristal Qadir, MD - Primary  PHYSICIAN ASSISTANT:   ASSISTANTS: debbi dallas    ANESTHESIA:   general  EBL:  Total I/O In: 1000 [I.V.:1000] Out: 10 [Blood:10]  BLOOD ADMINISTERED:none  DRAINS: none   LOCAL MEDICATIONS USED:  MARCAINE     SPECIMEN:  No Specimen  DISPOSITION OF SPECIMEN:  N/A  COUNTS:  YES  TOURNIQUET:   Total Tourniquet Time Documented: Thigh (Right) - 72 minutes Total: Thigh (Right) - 72 minutes   DICTATION: .[name] Dictation  PLAN OF CARE: Discharge to home after PACU  PATIENT DISPOSITION:  PACU - hemodynamically stable.   Delay start of Pharmacological VTE agent (>24hrs) due to surgical blood loss or risk of bleeding: not applicable  Details of procedure The surgical site was confirmed and marked as right ankle in the preop area  The patient taken to the surgical suite and general anesthesia was administered. A tourniquet was used on the right thigh. Sterile prep and drape was performed DuraPrep. The limb was exsanguinated by elevation of for 5 minutes a tourniquet was inflated to 300 millimeters of mercury. A lateral approach to the distal fibula was performed. Skin incision was made over the fibula. Full-thickness skin flaps were carried down to bone. Subperiosteal elevation was done to expose the fracture. The fracture was reduced and held with a bone clamp, radiographs confirmed reduction. A lateral 7-hole plate was applied using AO technique and nonlocking mode. Radiographs confirm reduction of the mortise.  A longitudinal incision was made over the medial malleolus again subcutaneous  tissue was taken down to bone with full-thickness flaps. A hole was drilled in the tibia and a pointed reduction clamp was used to hold the medial malleolus reduced. Prior to reduction the joint was irrigated and soft tissue debridement was performed to aid in fracture reduction  2 K wires were placed in parallel fashion.  The K wires were cut and tapped into the bone  Final x-rays confirmed fracture reduction and mortise was reduced.  The wounds were injected with Marcaine with epinephrine. The medial side was closed with 3-0 nylon suture in interrupted fashion. The lateral side was closed with 2-0 Monocryl and staples   Sterile dressings were applied.  Posterior splint was applied

## 2014-01-26 NOTE — Interval H&P Note (Signed)
History and Physical Interval Note:  01/26/2014 12:52 PM  Michele BurdockSherry N Ciulla  has presented today for surgery, with the diagnosis of Closed fracture bimalleolar right ankle  The various methods of treatment have been discussed with the patient and family. After consideration of risks, benefits and other options for treatment, the patient has consented to  Procedure(s): OPEN TREATMENT INTERNAL FIXATION RIGHT ANKLE FRACTURE (Right) as a surgical intervention .  The patient's history has been reviewed, patient examined, no change in status, stable for surgery.  I have reviewed the patient's chart and labs.  Questions were answered to the patient's satisfaction.     Fuller CanadaStanley Orilla Templeman

## 2014-01-27 ENCOUNTER — Telehealth: Payer: Self-pay | Admitting: Orthopedic Surgery

## 2014-01-27 NOTE — Telephone Encounter (Signed)
Ewing SchleinSherry Hilmes says the directions for her Oxycodone 5/325 says to take 1 tablet every 4 hours.  She asked if she can take 2 instead of the 1 because it is not touching her pain

## 2014-01-27 NOTE — Telephone Encounter (Signed)
Left message and advised to take as directed one every four hours.

## 2014-01-28 ENCOUNTER — Encounter (HOSPITAL_COMMUNITY): Payer: Self-pay | Admitting: Orthopedic Surgery

## 2014-01-31 ENCOUNTER — Encounter: Payer: Self-pay | Admitting: Orthopedic Surgery

## 2014-01-31 ENCOUNTER — Ambulatory Visit (INDEPENDENT_AMBULATORY_CARE_PROVIDER_SITE_OTHER): Payer: Self-pay | Admitting: Orthopedic Surgery

## 2014-01-31 VITALS — BP 120/71 | Ht <= 58 in | Wt 160.0 lb

## 2014-01-31 DIAGNOSIS — S82891A Other fracture of right lower leg, initial encounter for closed fracture: Secondary | ICD-10-CM

## 2014-01-31 DIAGNOSIS — S82899A Other fracture of unspecified lower leg, initial encounter for closed fracture: Secondary | ICD-10-CM

## 2014-01-31 DIAGNOSIS — S82843A Displaced bimalleolar fracture of unspecified lower leg, initial encounter for closed fracture: Secondary | ICD-10-CM

## 2014-01-31 MED ORDER — OXYCODONE-ACETAMINOPHEN 5-325 MG PO TABS: 1.0000 | ORAL_TABLET | ORAL | Status: DC | PRN

## 2014-01-31 NOTE — Progress Notes (Signed)
Patient ID: Gerlene BurdockSherry N Fowler, female   DOB: [birth year], 28 y.o.   MRN: [id number]   Chief Complaint  Patient presents with  . Follow-up    Post op 1 Right ankle DOS 01/26/14    OTIF BIMALLEOLAR FRACTURE   WOUNDS ARE CLEAN   POSTERIOR SPLINT REAPPLIED   RETURN FOR X-RAYS STAPLES SUTURES OUT   Encounter Diagnoses  Name Primary?  Marland Kitchen. Ankle fracture, right Yes  . Ankle fracture, bimalleolar, closed

## 2014-02-08 ENCOUNTER — Encounter: Payer: Self-pay | Admitting: Orthopedic Surgery

## 2014-02-08 ENCOUNTER — Other Ambulatory Visit: Payer: Self-pay | Admitting: *Deleted

## 2014-02-08 ENCOUNTER — Ambulatory Visit (INDEPENDENT_AMBULATORY_CARE_PROVIDER_SITE_OTHER): Payer: Self-pay

## 2014-02-08 ENCOUNTER — Ambulatory Visit (INDEPENDENT_AMBULATORY_CARE_PROVIDER_SITE_OTHER): Payer: Self-pay | Admitting: Orthopedic Surgery

## 2014-02-08 VITALS — Ht <= 58 in | Wt 160.0 lb

## 2014-02-08 DIAGNOSIS — S82891A Other fracture of right lower leg, initial encounter for closed fracture: Secondary | ICD-10-CM

## 2014-02-08 DIAGNOSIS — S82843A Displaced bimalleolar fracture of unspecified lower leg, initial encounter for closed fracture: Secondary | ICD-10-CM

## 2014-02-08 DIAGNOSIS — S82899A Other fracture of unspecified lower leg, initial encounter for closed fracture: Secondary | ICD-10-CM

## 2014-02-08 MED ORDER — OXYCODONE-ACETAMINOPHEN 5-325 MG PO TABS: 1.0000 | ORAL_TABLET | ORAL | Status: DC | PRN

## 2014-02-08 NOTE — Patient Instructions (Addendum)
Surgery for cast application 02/16/14

## 2014-02-08 NOTE — Progress Notes (Signed)
Patient ID: Michele BurdockSherry N Fowler, female   DOB: [birth year], 28 y.o.   MRN: [id number] Chief Complaint  Patient presents with  . Follow-up    Post op #2, 8 day recheck on right ankle with xray. DOS 01-26-14.    Encounter Diagnoses  Name Primary?  Marland Kitchen. Ankle fracture, right Yes  . Ankle fracture, bimalleolar, closed     Ht 4\' 9"  (1.448 m)  Wt 160 lb (72.576 kg)  BMI 34.61 kg/m2  LMP 01/27/2014  X-rays today show fracture alignment normal ankle mortise stable no hardware complications. Wounds are clean dry and intact. The patient has a flexion deficit in her knee and I think it's preventing her from obtaining full dorsiflexion of the foot. I attempted splinting and casting unsuccessfully. She will need general anesthesia to apply a cast in neutral position a may need serial casting to bring the foot up if not Achilles tendon release or lengthening

## 2014-02-09 ENCOUNTER — Other Ambulatory Visit: Payer: Self-pay | Admitting: *Deleted

## 2014-02-09 ENCOUNTER — Encounter (HOSPITAL_COMMUNITY): Payer: Self-pay | Admitting: Pharmacy Technician

## 2014-02-11 ENCOUNTER — Encounter (HOSPITAL_COMMUNITY): Payer: Self-pay

## 2014-02-11 ENCOUNTER — Encounter (HOSPITAL_COMMUNITY)
Admission: RE | Admit: 2014-02-11 | Discharge: 2014-02-11 | Disposition: A | Discharge status: Home / Self Care | Payer: Self-pay | Source: Ambulatory Visit | Attending: Orthopedic Surgery | Admitting: Orthopedic Surgery

## 2014-02-15 NOTE — H&P (Signed)
  Chief Complaint    Patient presents with    .   Ankle Injury          Right ankle fracture. DOI 01-23-14.      HISTORY: 28 year old female who was thrown from her horse landed on her right leg her horse may have actually stepped on her she sustained a bimalleolar closed right ankle fracture. She was injured on January 25 and seen in the emergency room on the same day. She underwent open treatment internal fixation of bimalleolar fracture. It was noticed in the postop period she was developing a flexion contracture at the ankle joint which is complicated by previous anterior cruciate ligament surgery where she did not regain full range of motion of her knee and has some type of contracture in the gastroc complex.  To improve her overall function it was decided to place her cast on anesthesia to get a plantigrade foot and ankle.  Review  of systems is positive for swelling and tingling the other review of systems normal. She's allergic to codeine. She has no major medical problems.  She's had bilateral hip surgery and a right anterior cruciate ligament reconstruction. She's on Synthroid medication. Her family history is positive for arthritis cancer and diabetes  Social history single  Her smoking history is positive for half pack per day she doesn't drink she completed her education through the 11th grade    History reviewed. No pertinent past medical history. Past Surgical History    Procedure   Laterality   Date    .   Hip surgery          .   Knee surgery           History reviewed. No pertinent family history.   The past, family history and social history have been reviewed and are recorded in the corresponding sections of epic   BP 119/82  Ht 4\' 9"  (1.448 m)  Wt 160 lb (72.576 kg)  BMI 34.61 kg/m2  LMP 12/26/2013 General appearance is normal, the patient is alert and oriented x3 with normal mood and affect. Her body habitus is endomorphic  She is ambulatory with crutches  nonweightbearing. Her upper extremities have normal range of motion stability strength and skin. Pulse temperature normal without edema there is no lymphadenopathy there is normal sensation no pathologic reflexes  The left lower extremity is normal  the skin incisions look clean dry and intact his plantar flexion contracture and it is impossible to get her foot in neutral position  X-rays show bimalleolar fracture  with internal fixation which is stable  Impression c status post closed bimalleolar ankle fracture right leg and ankle with plantar flexion contracture  Plan cast under anesthesia. Right leg. Ankle.

## 2014-02-16 ENCOUNTER — Encounter (HOSPITAL_COMMUNITY)
Admission: RE | Disposition: A | Discharge status: Home / Self Care | Payer: Self-pay | Source: Ambulatory Visit | Attending: Orthopedic Surgery

## 2014-02-16 ENCOUNTER — Encounter (HOSPITAL_COMMUNITY): Payer: Self-pay | Admitting: *Deleted

## 2014-02-16 ENCOUNTER — Ambulatory Visit (HOSPITAL_COMMUNITY)
Admission: RE | Admit: 2014-02-16 | Discharge: 2014-02-16 | Disposition: A | Discharge status: Home / Self Care | Payer: Self-pay | Source: Ambulatory Visit | Attending: Orthopedic Surgery | Admitting: Orthopedic Surgery

## 2014-02-16 ENCOUNTER — Encounter (HOSPITAL_COMMUNITY): Payer: Self-pay | Admitting: Anesthesiology

## 2014-02-16 ENCOUNTER — Ambulatory Visit (HOSPITAL_COMMUNITY): Payer: Self-pay

## 2014-02-16 ENCOUNTER — Ambulatory Visit (HOSPITAL_COMMUNITY): Payer: Self-pay | Admitting: Anesthesiology

## 2014-02-16 DIAGNOSIS — F17200 Nicotine dependence, unspecified, uncomplicated: Secondary | ICD-10-CM | POA: Insufficient documentation

## 2014-02-16 DIAGNOSIS — M24576 Contracture, unspecified foot: Principal | ICD-10-CM

## 2014-02-16 DIAGNOSIS — M24573 Contracture, unspecified ankle: Secondary | ICD-10-CM | POA: Insufficient documentation

## 2014-02-16 DIAGNOSIS — S82899A Other fracture of unspecified lower leg, initial encounter for closed fracture: Secondary | ICD-10-CM

## 2014-02-16 DIAGNOSIS — S82891A Other fracture of right lower leg, initial encounter for closed fracture: Secondary | ICD-10-CM

## 2014-02-16 HISTORY — PX: CAST APPLICATION: SHX380

## 2014-02-16 LAB — PREGNANCY, URINE: Preg Test, Ur: NEGATIVE

## 2014-02-16 SURGERY — APPLICATION, CAST
Anesthesia: General | Site: Ankle | Laterality: Right

## 2014-02-16 MED ORDER — ROCURONIUM BROMIDE 50 MG/5ML IV SOLN
INTRAVENOUS | Status: AC
  Filled 2014-02-16: qty 1

## 2014-02-16 MED ORDER — OXYCODONE-ACETAMINOPHEN 5-325 MG PO TABS: 1.0000 | ORAL_TABLET | ORAL | Status: DC | PRN

## 2014-02-16 MED ORDER — LACTATED RINGERS IV SOLN
INTRAVENOUS | Status: DC | PRN
  Administered 2014-02-16: 12:00:00 via INTRAVENOUS

## 2014-02-16 MED ORDER — LIDOCAINE HCL (PF) 1 % IJ SOLN
INTRAMUSCULAR | Status: AC
  Filled 2014-02-16: qty 5

## 2014-02-16 MED ORDER — PROPOFOL 10 MG/ML IV BOLUS
INTRAVENOUS | Status: DC | PRN
  Administered 2014-02-16: 50 mg via INTRAVENOUS
  Administered 2014-02-16: 150 mg via INTRAVENOUS

## 2014-02-16 MED ORDER — FENTANYL CITRATE 0.05 MG/ML IJ SOLN
INTRAMUSCULAR | Status: AC
  Filled 2014-02-16: qty 5

## 2014-02-16 MED ORDER — LIDOCAINE HCL (CARDIAC) 10 MG/ML IV SOLN
INTRAVENOUS | Status: DC | PRN
  Administered 2014-02-16: 30 mg via INTRAVENOUS

## 2014-02-16 MED ORDER — MIDAZOLAM HCL 5 MG/5ML IJ SOLN
INTRAMUSCULAR | Status: DC | PRN
  Administered 2014-02-16 (×2): 1 mg via INTRAVENOUS

## 2014-02-16 MED ORDER — FENTANYL CITRATE 0.05 MG/ML IJ SOLN
INTRAMUSCULAR | Status: AC
  Filled 2014-02-16: qty 2

## 2014-02-16 MED ORDER — MIDAZOLAM HCL 2 MG/2ML IJ SOLN
INTRAMUSCULAR | Status: AC
  Filled 2014-02-16: qty 2

## 2014-02-16 MED ORDER — PROPOFOL 10 MG/ML IV EMUL
INTRAVENOUS | Status: AC
  Filled 2014-02-16: qty 20

## 2014-02-16 MED ORDER — CHLORHEXIDINE GLUCONATE 4 % EX LIQD: 60.0000 mL | Freq: Once | CUTANEOUS | Status: DC

## 2014-02-16 MED ORDER — FENTANYL CITRATE 0.05 MG/ML IJ SOLN
INTRAMUSCULAR | Status: DC | PRN
  Administered 2014-02-16: 50 ug via INTRAVENOUS
  Administered 2014-02-16 (×2): 25 ug via INTRAVENOUS
  Administered 2014-02-16: 50 ug via INTRAVENOUS
  Administered 2014-02-16 (×2): 25 ug via INTRAVENOUS
  Administered 2014-02-16: 50 ug via INTRAVENOUS
  Administered 2014-02-16 (×2): 25 ug via INTRAVENOUS
  Administered 2014-02-16: 50 ug via INTRAVENOUS

## 2014-02-16 MED ORDER — CEFAZOLIN SODIUM-DEXTROSE 2-3 GM-% IV SOLR
INTRAVENOUS | Status: AC
  Filled 2014-02-16: qty 50

## 2014-02-16 MED ORDER — STERILE WATER FOR IRRIGATION IR SOLN
Status: DC | PRN
  Administered 2014-02-16: 2000 mL

## 2014-02-16 MED ORDER — CEFAZOLIN SODIUM-DEXTROSE 2-3 GM-% IV SOLR
2.0000 g | INTRAVENOUS | Status: AC
  Administered 2014-02-16: 2 g via INTRAVENOUS

## 2014-02-16 SURGICAL SUPPLY — 15 items
CLOTH BEACON ORANGE TIMEOUT ST (SAFETY) ×3 IMPLANT
GOWN STRL REUS W/TWL XL LVL3 (GOWN DISPOSABLE) ×1 IMPLANT
KIT ROOM TURNOVER APOR (KITS) ×3 IMPLANT
PAD CAST 3X4 CTTN HI CHSV (CAST SUPPLIES) IMPLANT
PAD CAST 4YDX4 CTTN HI CHSV (CAST SUPPLIES) IMPLANT
PADDING CAST COTTON 3X4 STRL (CAST SUPPLIES)
PADDING CAST COTTON 4X4 STRL (CAST SUPPLIES) ×6
PADDING CAST COTTON 6X4 STRL (CAST SUPPLIES) IMPLANT
PADDING WEBRIL 4 STERILE (GAUZE/BANDAGES/DRESSINGS) ×2 IMPLANT
STOCKINETTE TUBE COTTON 3IN (GAUZE/BANDAGES/DRESSINGS) IMPLANT
STOCKINETTE TUBULAR 6 INCH (GAUZE/BANDAGES/DRESSINGS) IMPLANT
STOCKINETTE TUBULAR COTT 4X25 (GAUZE/BANDAGES/DRESSINGS) ×2 IMPLANT
STOCKINETTE TUBULAR COTTON 3IN (GAUZE/BANDAGES/DRESSINGS)
TAPE CAST 4X4 WHT DELT L NS LF (CAST SUPPLIES) ×6 IMPLANT
WATER STERILE IRR 1000ML POUR (IV SOLUTION) ×6 IMPLANT

## 2014-02-16 NOTE — Anesthesia Postprocedure Evaluation (Signed)
  Anesthesia Post-op Note  Patient: Michele Fowler  Procedure(s) Performed: Procedure(s): CAST APPLICATION RIGHT ANKLE (Right)  Patient Location: PACU  Anesthesia Type:General  Level of Consciousness: awake, alert  and oriented  Airway and Oxygen Therapy: Patient Spontanous Breathing and Patient connected to nasal cannula oxygen  Post-op Pain: moderate  Post-op Assessment: Post-op Vital signs reviewed, Patient's Cardiovascular Status Stable, Respiratory Function Stable, Patent Airway and No signs of Nausea or vomiting  Post-op Vital Signs: Reviewed and stable  Complications: No apparent anesthesia complications

## 2014-02-16 NOTE — Anesthesia Preprocedure Evaluation (Addendum)
Anesthesia Evaluation  Patient identified by MRN, date of birth, ID band Patient awake    Reviewed: Allergy & Precautions, H&P , NPO status , Patient's Chart, lab work & pertinent test results  Airway Mallampati: I TM Distance: >3 FB Neck ROM: Full    Dental  (+) Teeth Intact   Pulmonary Current Smoker,  breath sounds clear to auscultation        Cardiovascular negative cardio ROS  Rhythm:Regular Rate:Normal     Neuro/Psych    GI/Hepatic negative GI ROS,   Endo/Other  Hypothyroidism   Renal/GU      Musculoskeletal   Abdominal   Peds  Hematology   Anesthesia Other Findings   Reproductive/Obstetrics                         Anesthesia Physical  Anesthesia Plan  ASA: II  Anesthesia Plan: General   Post-op Pain Management:    Induction: Intravenous  Airway Management Planned: LMA  Additional Equipment:   Intra-op Plan:   Post-operative Plan: Extubation in OR  Informed Consent: I have reviewed the patients History and Physical, chart, labs and discussed the procedure including the risks, benefits and alternatives for the proposed anesthesia with the patient or authorized representative who has indicated his/her understanding and acceptance.     Plan Discussed with:   Anesthesia Plan Comments:       Anesthesia Quick Evaluation

## 2014-02-16 NOTE — Discharge Instructions (Signed)
Cast or Splint Care  Casts and splints support injured limbs and keep bones from moving while they heal. It is important to care for your cast or splint at home.   HOME CARE INSTRUCTIONS   Keep the cast or splint uncovered during the drying period. It can take 24 to 48 hours to dry if it is made of plaster. A fiberglass cast will dry in less than 1 hour.   Do not rest the cast on anything harder than a pillow for the first 24 hours.   Do not put weight on your injured limb or apply pressure to the cast until your health care provider gives you permission.   Keep the cast or splint dry. Wet casts or splints can lose their shape and may not support the limb as well. A wet cast that has lost its shape can also create harmful pressure on your skin when it dries. Also, wet skin can become infected.   Cover the cast or splint with a plastic bag when bathing or when out in the rain or snow. If the cast is on the trunk of the body, take sponge baths until the cast is removed.   If your cast does become wet, dry it with a towel or a blow dryer on the cool setting only.   Keep your cast or splint clean. Soiled casts may be wiped with a moistened cloth.   Do not place any hard or soft foreign objects under your cast or splint, such as cotton, toilet paper, lotion, or powder.   Do not try to scratch the skin under the cast with any object. The object could get stuck inside the cast. Also, scratching could lead to an infection. If itching is a problem, use a blow dryer on a cool setting to relieve discomfort.   Do not trim or cut your cast or remove padding from inside of it.   Exercise all joints next to the injury that are not immobilized by the cast or splint. For example, if you have a long leg cast, exercise the hip joint and toes. If you have an arm cast or splint, exercise the shoulder, elbow, thumb, and fingers.   Elevate your injured arm or leg on 1 or 2 pillows for the first 1 to 3 days to decrease  swelling and pain.It is best if you can comfortably elevate your cast so it is higher than your heart.  SEEK MEDICAL CARE IF:    Your cast or splint cracks.   Your cast or splint is too tight or too loose.   You have unbearable itching inside the cast.   Your cast becomes wet or develops a soft spot or area.   You have a bad smell coming from inside your cast.   You get an object stuck under your cast.   Your skin around the cast becomes red or raw.   You have new pain or worsening pain after the cast has been applied.  SEEK IMMEDIATE MEDICAL CARE IF:    You have fluid leaking through the cast.   You are unable to move your fingers or toes.   You have discolored (blue or white), cool, painful, or very swollen fingers or toes beyond the cast.   You have tingling or numbness around the injured area.   You have severe pain or pressure under the cast.   You have any difficulty with your breathing or have shortness of breath.   You have chest   pain.  Document Released: 12/13/2000 Document Revised: 10/06/2013 Document Reviewed: 06/24/2013  [employer] Patient Information 2014 [employer]

## 2014-02-16 NOTE — Interval H&P Note (Signed)
History and Physical Interval Note:  02/16/2014 12:22 PM  Michele Fowler  has presented today for surgery, with the diagnosis of status post closed bimalleolar ankle fracture right leg and ankle with plantar flexion contracture  The various methods of treatment have been discussed with the patient and family. After consideration of risks, benefits and other options for treatment, the patient has consented to  Procedure(s): CAST APPLICATION RIGHT ANKLE (Right) as a surgical intervention .  The patient's history has been reviewed, patient examined, no change in status, stable for surgery.  I have reviewed the patient's chart and labs.  Questions were answered to the patient's satisfaction.     Fuller CanadaStanley Albertha Beattie

## 2014-02-16 NOTE — Brief Op Note (Addendum)
02/16/2014  1:03 PM  PATIENT:  Michele Fowler  28 y.o. 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 This was exacerbated by the ankle fracture. She also has approximately to 15 deficit of knee flexion right compared to left after anterior cruciate ligament reconstruction. She was aware of this prior to ankle surgery.  The wounds were clean dry and intact with no signs of infection I was able to get her foot just in neutral position even with maximum knee flexion.  Details of procedure after proper site marking and patient identification and chart update the patient was taken to surgery and had general LMA anesthesia. After appropriate timeout the right lower chart he was casted from below the knee to the toes with the foot in maximal dorsiflexion which again was 0.  The cast was held until it dried and the patient was reversed from anesthesia and taken recovery room in stable condition  SURGEON:  Surgeon(s) and Role:    * Vickki HearingStanley E Kiyla Ringler, MD - Primary  PHYSICIAN ASSISTANT:   ASSISTANTS: none   ANESTHESIA:   general  EBL:  Total I/O In: 600 [I.V.:600] Out: 0   BLOOD ADMINISTERED:none  DRAINS: none   LOCAL MEDICATIONS USED:  NONE  SPECIMEN:  No Specimen and Simple Mastectomy  DISPOSITION OF SPECIMEN:  N/A  COUNTS:  YES  TOURNIQUET:  * No tourniquets in log *  DICTATION: .Dragon Dictation  PLAN OF CARE: Discharge to home after PACU  PATIENT DISPOSITION:  PACU - hemodynamically stable.   Delay start of Pharmacological VTE agent (>24hrs) due to surgical blood loss or  risk of bleeding: not applicable

## 2014-02-16 NOTE — Transfer of Care (Signed)
Immediate Anesthesia Transfer of Care Note  Patient: Michele Fowler  Procedure(s) Performed: Procedure(s): CAST APPLICATION RIGHT ANKLE (Right)  Patient Location: PACU  Anesthesia Type:General  Level of Consciousness: awake, alert  and oriented  Airway & Oxygen Therapy: Patient Spontanous Breathing and Patient connected to face mask oxygen  Post-op Assessment: Report given to PACU RN  Post vital signs: Reviewed and stable  Complications: No apparent anesthesia complications

## 2014-02-16 NOTE — Op Note (Addendum)
02/16/2014  1:03 PM  PATIENT:  Michele Fowler  28 y.o. 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 This was exacerbated by the ankle fracture. She also has approximately to 15 deficit of knee flexion right compared to left after anterior cruciate ligament reconstruction. She was aware of this prior to ankle surgery.  The wounds were clean dry and intact with no signs of infection I was able to get her foot just in neutral position even with maximum knee flexion.  Details of procedure after proper site marking and patient identification and chart update the patient was taken to surgery and had general LMA anesthesia. After appropriate timeout the right lower chart he was casted from below the knee to the toes with the foot in maximal dorsiflexion which again was 0.  The cast was held until it dried and the patient was reversed from anesthesia and taken recovery room in stable condition  SURGEON:  Surgeon(s) and Role:    * Vickki HearingStanley E Harrison, MD - Primary  PHYSICIAN ASSISTANT:   ASSISTANTS: none   ANESTHESIA:   general  EBL:  Total I/O In: 600 [I.V.:600] Out: 0   BLOOD ADMINISTERED:none  DRAINS: none   LOCAL MEDICATIONS USED:  NONE  SPECIMEN:  No Specimen and Simple Mastectomy  DISPOSITION OF SPECIMEN:  N/A  COUNTS:  YES  TOURNIQUET:  * No tourniquets in log *  DICTATION: .Dragon Dictation  PLAN OF CARE: Discharge to home after PACU  PATIENT DISPOSITION:  PACU - hemodynamically stable.   Delay start of Pharmacological VTE agent (>24hrs) due to surgical blood loss or  risk of bleeding: not applicable  Cpt code [id number] icd 9 824.4

## 2014-02-16 NOTE — Anesthesia Procedure Notes (Signed)
Procedure Name: LMA Insertion Date/Time: 02/16/2014 12:43 PM Performed by: Glynn OctaveANIEL, Dorcus Riga E Pre-anesthesia Checklist: Patient identified, Patient being monitored, Emergency Drugs available, Timeout performed and Suction available Patient Re-evaluated:Patient Re-evaluated prior to inductionOxygen Delivery Method: Circle System Utilized Preoxygenation: Pre-oxygenation with 100% oxygen Intubation Type: IV induction Ventilation: Mask ventilation without difficulty LMA: LMA inserted LMA Size: 3.0 Number of attempts: 1 Placement Confirmation: positive ETCO2 and breath sounds checked- equal and bilateral

## 2014-02-17 ENCOUNTER — Encounter (HOSPITAL_COMMUNITY): Payer: Self-pay | Admitting: Orthopedic Surgery

## 2014-02-21 ENCOUNTER — Ambulatory Visit: Payer: Self-pay | Admitting: Orthopedic Surgery

## 2014-02-28 ENCOUNTER — Telehealth: Payer: Self-pay | Admitting: Orthopedic Surgery

## 2014-02-28 ENCOUNTER — Other Ambulatory Visit: Payer: Self-pay | Admitting: *Deleted

## 2014-02-28 DIAGNOSIS — S82891A Other fracture of right lower leg, initial encounter for closed fracture: Secondary | ICD-10-CM

## 2014-02-28 MED ORDER — OXYCODONE-ACETAMINOPHEN 5-325 MG PO TABS: 1.0000 | ORAL_TABLET | ORAL | Status: DC | PRN

## 2014-02-28 NOTE — Telephone Encounter (Signed)
Refilled per Dr. Romeo AppleHarrison and left message prescription was ready to be picked up.

## 2014-02-28 NOTE — Telephone Encounter (Signed)
Routing to Dr Harrison 

## 2014-02-28 NOTE — Telephone Encounter (Signed)
refill 

## 2014-02-28 NOTE — Telephone Encounter (Signed)
Ewing SchleinSherry Boesel asked for a prescription  for Oxycodone 5/325

## 2014-03-08 ENCOUNTER — Ambulatory Visit (INDEPENDENT_AMBULATORY_CARE_PROVIDER_SITE_OTHER): Payer: Self-pay | Admitting: Orthopedic Surgery

## 2014-03-08 VITALS — BP 111/54 | Ht <= 58 in | Wt 160.0 lb

## 2014-03-08 DIAGNOSIS — S82843A Displaced bimalleolar fracture of unspecified lower leg, initial encounter for closed fracture: Secondary | ICD-10-CM

## 2014-03-08 DIAGNOSIS — S82899A Other fracture of unspecified lower leg, initial encounter for closed fracture: Secondary | ICD-10-CM

## 2014-03-08 DIAGNOSIS — S82891A Other fracture of right lower leg, initial encounter for closed fracture: Secondary | ICD-10-CM

## 2014-03-08 MED ORDER — OXYCODONE-ACETAMINOPHEN 5-325 MG PO TABS: 1.0000 | ORAL_TABLET | ORAL | Status: DC | PRN

## 2014-03-08 NOTE — Progress Notes (Signed)
Patient ID: Michele BurdockSherry N Neels, female   DOB: 23-Dec-1986, 28 y.o.   MRN: [id number]  Chief Complaint  Patient presents with  . Follow-up    Post op cast application DOS 02/16/14   BP 111/54  Ht 4\' 9"  (1.448 m)  Wt 160 lb (72.576 kg)  BMI 34.61 kg/m2  LMP 01/27/2014  Status post Cast application status post open treatment internal fixation right ankle on January 28 cast applied February 18. Cast applied to give a plantigrade patient has chronic plantar flexion deformity right ankle  Cast intact no complications followup April 1 for x-rays in the cast. Determine at that time when the cast can come off.

## 2014-03-22 ENCOUNTER — Other Ambulatory Visit: Payer: Self-pay | Admitting: Orthopedic Surgery

## 2014-03-22 ENCOUNTER — Telehealth: Payer: Self-pay | Admitting: Orthopedic Surgery

## 2014-03-22 DIAGNOSIS — S82891A Other fracture of right lower leg, initial encounter for closed fracture: Secondary | ICD-10-CM

## 2014-03-22 MED ORDER — OXYCODONE-ACETAMINOPHEN 5-325 MG PO TABS: 1.0000 | ORAL_TABLET | Freq: Four times a day (QID) | ORAL | Status: DC | PRN

## 2014-03-22 NOTE — Telephone Encounter (Signed)
Approved.  

## 2014-03-22 NOTE — Telephone Encounter (Signed)
Routing to Dr Harrison 

## 2014-03-22 NOTE — Telephone Encounter (Signed)
Patient called to request refill on medication: oxyCODONE-acetaminophen (ROXICET) 5-325 MG per tablet  Her next scheduled follow up appointment is 03/31/14.  Please call her at [phone].

## 2014-03-22 NOTE — Telephone Encounter (Signed)
Medication refilled per Dr. Romeo AppleHarrison and patient picked up prescription.

## 2014-03-31 ENCOUNTER — Encounter: Payer: Self-pay | Admitting: Orthopedic Surgery

## 2014-03-31 ENCOUNTER — Ambulatory Visit (INDEPENDENT_AMBULATORY_CARE_PROVIDER_SITE_OTHER): Payer: Self-pay

## 2014-03-31 ENCOUNTER — Ambulatory Visit (INDEPENDENT_AMBULATORY_CARE_PROVIDER_SITE_OTHER): Payer: Self-pay | Admitting: Orthopedic Surgery

## 2014-03-31 VITALS — BP 126/72 | Ht <= 58 in | Wt 160.0 lb

## 2014-03-31 DIAGNOSIS — S82891A Other fracture of right lower leg, initial encounter for closed fracture: Secondary | ICD-10-CM

## 2014-03-31 DIAGNOSIS — S82843A Displaced bimalleolar fracture of unspecified lower leg, initial encounter for closed fracture: Secondary | ICD-10-CM

## 2014-03-31 DIAGNOSIS — S82899A Other fracture of unspecified lower leg, initial encounter for closed fracture: Secondary | ICD-10-CM

## 2014-03-31 MED ORDER — OXYCODONE-ACETAMINOPHEN 5-325 MG PO TABS: 1.0000 | ORAL_TABLET | Freq: Four times a day (QID) | ORAL | Status: DC | PRN

## 2014-03-31 NOTE — Progress Notes (Signed)
Patient ID: Michele Fowler, female   DOB: [birth year], 28 y.o.   MRN: [id number]  Chief Complaint  Patient presents with  . Follow-up    3 week recheck on right ankle with xray. DOS 02-16-14.     Encounter Diagnoses  Name Primary?  Marland Kitchen. Ankle fracture, right Yes  . Ankle fracture, bimalleolar, closed     BP 126/72  Ht 4\' 9"  (1.448 m)  Wt 160 lb (72.576 kg)  BMI 34.61 kg/m2  This is the ninth week status post open treatment internal fixation of the right ankle with lateral plate and medial K wires Cast under anesthesia February 18 Today for x-ray she's been in cast.  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 Therefore she is placed in an Aircast and advised and instructed on ankle range of motion exercises. Full weightbearing with crutches and Aircast followup 3 weeks x-ray right ankle  Meds ordered this encounter  Medications  . oxyCODONE-acetaminophen (ROXICET) 5-325 MG per tablet    Sig: Take 1 tablet by mouth every 6 (six) hours as needed for severe pain.    Dispense:  56 tablet    Refill:  0    Note you can only take 1 tablet every 6 hours or total 4 per day

## 2014-03-31 NOTE — Patient Instructions (Signed)
Ankle ROM

## 2014-04-21 ENCOUNTER — Ambulatory Visit (INDEPENDENT_AMBULATORY_CARE_PROVIDER_SITE_OTHER): Payer: Self-pay | Admitting: Orthopedic Surgery

## 2014-04-21 ENCOUNTER — Encounter: Payer: Self-pay | Admitting: Orthopedic Surgery

## 2014-04-21 ENCOUNTER — Ambulatory Visit (INDEPENDENT_AMBULATORY_CARE_PROVIDER_SITE_OTHER): Payer: Self-pay

## 2014-04-21 VITALS — BP 131/76 | Ht <= 58 in | Wt 160.0 lb

## 2014-04-21 DIAGNOSIS — S82843A Displaced bimalleolar fracture of unspecified lower leg, initial encounter for closed fracture: Secondary | ICD-10-CM

## 2014-04-21 DIAGNOSIS — S82899A Other fracture of unspecified lower leg, initial encounter for closed fracture: Secondary | ICD-10-CM

## 2014-04-21 DIAGNOSIS — S82891A Other fracture of right lower leg, initial encounter for closed fracture: Secondary | ICD-10-CM

## 2014-04-21 MED ORDER — OXYCODONE-ACETAMINOPHEN 5-325 MG PO TABS: 1.0000 | ORAL_TABLET | Freq: Four times a day (QID) | ORAL | Status: DC | PRN

## 2014-04-21 NOTE — Progress Notes (Signed)
Patient ID: Michele Fowler, female   DOB: 05/29/1986, 28 y.o.   MRN: [id number]  Chief Complaint  Patient presents with  . Follow-up    3 week recheck on right ankle with xray. DOS 01-26-14/OTIF AND 2-18 CAST APP UNDER ANESTH   Encounter Diagnoses  Name Primary?  Marland Kitchen. Ankle fracture, right   . Ankle fracture, bimalleolar, closed Yes   BP 131/76  Ht 4\' 9"  (1.448 m)  Wt 160 lb (72.576 kg)  BMI 34.61 kg/m2 X-rays today show excellent incorporation of the fracture with excellent healing in stable internal fixation  Clinically the patient's wounds are healed nicely and there is mild edema  Recommend one-month followup no x-rays needed  Patient will continue weightbearing as tolerated in the Aircast with crutches as needed  Refill Percocet for pain secondary to codeine allergy.

## 2014-04-21 NOTE — Patient Instructions (Signed)
Continue to Wear brace

## 2014-05-11 ENCOUNTER — Telehealth: Payer: Self-pay | Admitting: Orthopedic Surgery

## 2014-05-11 NOTE — Telephone Encounter (Signed)
Routing to Dr Harrison 

## 2014-05-12 ENCOUNTER — Other Ambulatory Visit (HOSPITAL_COMMUNITY): Payer: Self-pay | Admitting: Family Medicine

## 2014-05-12 ENCOUNTER — Other Ambulatory Visit (HOSPITAL_COMMUNITY): Payer: Self-pay

## 2014-05-12 ENCOUNTER — Ambulatory Visit (HOSPITAL_COMMUNITY)
Admission: RE | Admit: 2014-05-12 | Discharge: 2014-05-12 | Disposition: A | Discharge status: Home / Self Care | Payer: Disability Insurance | Source: Ambulatory Visit | Attending: Family Medicine | Admitting: Family Medicine

## 2014-05-12 DIAGNOSIS — M25579 Pain in unspecified ankle and joints of unspecified foot: Secondary | ICD-10-CM

## 2014-05-12 DIAGNOSIS — M79606 Pain in leg, unspecified: Secondary | ICD-10-CM

## 2014-05-12 DIAGNOSIS — M25559 Pain in unspecified hip: Secondary | ICD-10-CM

## 2014-05-12 DIAGNOSIS — M79609 Pain in unspecified limb: Secondary | ICD-10-CM | POA: Insufficient documentation

## 2014-05-12 DIAGNOSIS — Z4789 Encounter for other orthopedic aftercare: Secondary | ICD-10-CM | POA: Insufficient documentation

## 2014-05-19 NOTE — Telephone Encounter (Signed)
Prescription refill request, routing back to Dr. Romeo AppleHarrison

## 2014-05-20 ENCOUNTER — Other Ambulatory Visit: Payer: Self-pay | Admitting: Orthopedic Surgery

## 2014-05-20 DIAGNOSIS — S82891A Other fracture of right lower leg, initial encounter for closed fracture: Secondary | ICD-10-CM

## 2014-05-20 MED ORDER — OXYCODONE-ACETAMINOPHEN 5-325 MG PO TABS: 1.0000 | ORAL_TABLET | Freq: Four times a day (QID) | ORAL | Status: AC | PRN

## 2014-05-24 ENCOUNTER — Ambulatory Visit (INDEPENDENT_AMBULATORY_CARE_PROVIDER_SITE_OTHER): Payer: Self-pay | Admitting: Orthopedic Surgery

## 2014-05-24 ENCOUNTER — Encounter: Payer: Self-pay | Admitting: Orthopedic Surgery

## 2014-05-24 VITALS — BP 120/47 | Ht <= 58 in | Wt 160.0 lb

## 2014-05-24 DIAGNOSIS — S82899A Other fracture of unspecified lower leg, initial encounter for closed fracture: Secondary | ICD-10-CM

## 2014-05-24 DIAGNOSIS — S82891A Other fracture of right lower leg, initial encounter for closed fracture: Secondary | ICD-10-CM

## 2014-05-24 DIAGNOSIS — S82843A Displaced bimalleolar fracture of unspecified lower leg, initial encounter for closed fracture: Secondary | ICD-10-CM

## 2014-05-24 NOTE — Patient Instructions (Signed)
Wean yourself off of the crutches  Wean yourself off of the percocet

## 2014-05-24 NOTE — Progress Notes (Signed)
Patient ID: Michele Fowler, female   DOB: 23-Mar-1986, 28 y.o.   MRN: [id number]  Chief Complaint  Patient presents with  . Follow-up    1 month recheck of right ankle, DOS 02-16-14.   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  PF contracture; even with knee flexion  S/p OTIF right ankle   D/C crutches; return July check range of motion ankle  No orders of the defined types were placed in this encounter.

## 2014-05-25 ENCOUNTER — Telehealth: Payer: Self-pay | Admitting: *Deleted

## 2014-05-25 NOTE — Telephone Encounter (Signed)
Patient called requesting refill on oxycodone 5/325 on  05/11/14, and it appears the note was closed out before being routed to you. Please advise regarding refill.

## 2014-05-26 NOTE — Telephone Encounter (Signed)
She got one 5-22 56 should last until 6-5 ???

## 2014-06-21 ENCOUNTER — Telehealth: Payer: Self-pay | Admitting: Orthopedic Surgery

## 2014-06-21 NOTE — Telephone Encounter (Signed)
Patient called requesting refill on oxycodone. Patient phone is [phone]

## 2014-06-27 NOTE — Telephone Encounter (Signed)
Routing to Dr Harrison 

## 2014-06-28 ENCOUNTER — Other Ambulatory Visit: Payer: Self-pay | Admitting: Orthopedic Surgery

## 2014-06-28 MED ORDER — OXYCODONE-ACETAMINOPHEN 5-325 MG PO TABS: 1.0000 | ORAL_TABLET | ORAL | Status: DC | PRN

## 2014-06-28 NOTE — Telephone Encounter (Signed)
Patient aware prescription is ready for pick up

## 2014-07-26 ENCOUNTER — Ambulatory Visit (INDEPENDENT_AMBULATORY_CARE_PROVIDER_SITE_OTHER): Payer: Self-pay | Admitting: Orthopedic Surgery

## 2014-07-26 DIAGNOSIS — S82899A Other fracture of unspecified lower leg, initial encounter for closed fracture: Secondary | ICD-10-CM

## 2014-07-26 DIAGNOSIS — S82891A Other fracture of right lower leg, initial encounter for closed fracture: Secondary | ICD-10-CM

## 2014-07-26 NOTE — Progress Notes (Addendum)
Patient ID: Michele Fowler, female   DOB: [birth year], 28 y.o.   MRN: [id number] Six-month followup visit (internal fixation right ankle. No complaints. Review of systems negative.  She's awake alert and oriented x3 mood and affect is normal she is ambulating without assistive devices. Her neurovascular exam is intact skin shows normal incisions. Her ankle is stable.   Clinical exam shows plantigrade foot. She complains of stiffness in the mornings and pain and aching when the weather changes were arranged. She is advised that this is just normal. She has no problems other than that. She can remove her ASO brace and followup with us as needed.  Diagnosis ankle fixation status post ankle fracture  Followup as needed

## 2014-07-26 NOTE — Patient Instructions (Signed)
May remove brace

## 2016-01-09 ENCOUNTER — Encounter (HOSPITAL_COMMUNITY): Payer: Self-pay | Admitting: Emergency Medicine

## 2016-01-09 ENCOUNTER — Emergency Department (HOSPITAL_COMMUNITY)
Admission: EM | Admit: 2016-01-09 | Discharge: 2016-01-09 | Disposition: A | Discharge status: Home / Self Care | Payer: Self-pay | Attending: Emergency Medicine | Admitting: Emergency Medicine

## 2016-01-09 DIAGNOSIS — E039 Hypothyroidism, unspecified: Secondary | ICD-10-CM | POA: Insufficient documentation

## 2016-01-09 DIAGNOSIS — Z79899 Other long term (current) drug therapy: Secondary | ICD-10-CM | POA: Insufficient documentation

## 2016-01-09 DIAGNOSIS — A5901 Trichomonal vulvovaginitis: Secondary | ICD-10-CM

## 2016-01-09 DIAGNOSIS — F17210 Nicotine dependence, cigarettes, uncomplicated: Secondary | ICD-10-CM | POA: Insufficient documentation

## 2016-01-09 DIAGNOSIS — Z3202 Encounter for pregnancy test, result negative: Secondary | ICD-10-CM | POA: Insufficient documentation

## 2016-01-09 LAB — URINALYSIS, ROUTINE W REFLEX MICROSCOPIC
Bilirubin Urine: NEGATIVE
Glucose, UA: NEGATIVE mg/dL
Ketones, ur: NEGATIVE mg/dL
NITRITE: NEGATIVE
PROTEIN: NEGATIVE mg/dL
SPECIFIC GRAVITY, URINE: 1.02 (ref 1.005–1.030)
pH: 6 (ref 5.0–8.0)

## 2016-01-09 LAB — WET PREP, GENITAL
Clue Cells Wet Prep HPF POC: NONE SEEN
SPERM: NONE SEEN
Yeast Wet Prep HPF POC: NONE SEEN

## 2016-01-09 LAB — URINE MICROSCOPIC-ADD ON

## 2016-01-09 LAB — POC URINE PREG, ED: PREG TEST UR: NEGATIVE

## 2016-01-09 MED ORDER — CEFTRIAXONE SODIUM 250 MG IJ SOLR
250.0000 mg | Freq: Once | INTRAMUSCULAR | Status: AC
  Administered 2016-01-09: 250 mg via INTRAMUSCULAR
  Filled 2016-01-09: qty 250

## 2016-01-09 MED ORDER — AZITHROMYCIN 250 MG PO TABS
1000.0000 mg | ORAL_TABLET | Freq: Once | ORAL | Status: AC
Start: 2016-01-09 — End: 2016-01-09
  Administered 2016-01-09: 1000 mg via ORAL
  Filled 2016-01-09: qty 4

## 2016-01-09 MED ORDER — METRONIDAZOLE 500 MG PO TABS: 500.0000 mg | ORAL_TABLET | Freq: Two times a day (BID) | ORAL | Status: DC

## 2016-01-09 MED ORDER — LIDOCAINE HCL (PF) 1 % IJ SOLN
INTRAMUSCULAR | Status: AC
  Administered 2016-01-09: 0.9 mL
  Filled 2016-01-09: qty 5

## 2016-01-09 NOTE — ED Notes (Signed)
Pt d/c papers and prescriptions given and reviewed. Pt instructed to wait 15 min after injection.

## 2016-01-09 NOTE — Discharge Instructions (Signed)
Trichomoniasis Trichomoniasis is an infection caused by an organism called Trichomonas. The infection can affect both women and men. In women, the outer female genitalia and the vagina are affected. In men, the penis is mainly affected, but the prostate and other reproductive organs can also be involved. Trichomoniasis is a sexually transmitted infection (STI) and is most often passed to another person through sexual contact.  RISK FACTORS  Having unprotected sexual intercourse.  Having sexual intercourse with an infected partner. SIGNS AND SYMPTOMS  Symptoms of trichomoniasis in women include:  Abnormal gray-green frothy vaginal discharge.  Itching and irritation of the vagina.  Itching and irritation of the area outside the vagina. Symptoms of trichomoniasis in men include:   Penile discharge with or without pain.  Pain during urination. This results from inflammation of the urethra. DIAGNOSIS  Trichomoniasis may be found during a Pap test or physical exam. Your health care provider may use one of the following methods to help diagnose this infection:  Testing the pH of the vagina with a test tape.  Using a vaginal swab test that checks for the Trichomonas organism. A test is available that provides results within a few minutes.  Examining a urine sample.  Testing vaginal secretions. Your health care provider may test you for other STIs, including HIV. TREATMENT   You may be given medicine to fight the infection. Women should inform their health care provider if they could be or are pregnant. Some medicines used to treat the infection should not be taken during pregnancy.  Your health care provider may recommend over-the-counter medicines or creams to decrease itching or irritation.  Your sexual partner will need to be treated if infected.  Your health care provider may test you for infection again 3 months after treatment. HOME CARE INSTRUCTIONS   Take medicines only as  directed by your health care provider.  Take over-the-counter medicine for itching or irritation as directed by your health care provider.  Do not have sexual intercourse while you have the infection.  Women should not douche or wear tampons while they have the infection.  Discuss your infection with your partner. Your partner may have gotten the infection from you, or you may have gotten it from your partner.  Have your sex partner get examined and treated if necessary.  Practice safe, informed, and protected sex.  See your health care provider for other STI testing. SEEK MEDICAL CARE IF:   You still have symptoms after you finish your medicine.  You develop abdominal pain.  You have pain when you urinate.  You have bleeding after sexual intercourse.  You develop a rash.  Your medicine makes you sick or makes you throw up (vomit). MAKE SURE YOU:  Understand these instructions.  Will watch your condition.  Will get help right away if you are not doing well or get worse.   This information is not intended to replace advice given to you by your health care provider. Make sure you discuss any questions you have with your health care provider.   Document Released: 06/11/2001 Document Revised: 01/06/2015 Document Reviewed: 09/27/2013 Elsevier Interactive Patient Education 2016 Elsevier Inc.  

## 2016-01-09 NOTE — ED Notes (Signed)
Pt states that she has been having white/yellow vaginal discharge with itching and burning for the past 2 weeks.  Has used OTC products with no relief.

## 2016-01-10 LAB — GC/CHLAMYDIA PROBE AMP (~~LOC~~) NOT AT ARMC
Chlamydia: NEGATIVE
Neisseria Gonorrhea: NEGATIVE

## 2016-01-11 LAB — URINE CULTURE
Culture: 60000
Special Requests: NORMAL

## 2016-01-11 NOTE — ED Provider Notes (Signed)
CSN: [id number]     Arrival date & time 01/09/16  1452 History   First MD Initiated Contact with Patient 01/09/16 1603     Chief Complaint  Patient presents with  . Vaginal Itching     (Consider location/radiation/quality/duration/timing/severity/associated sxs/prior Treatment) The history is provided by the patient.   Michele Fowler is a 30 y.o. female presenting with a 2 week history of white to yellow vaginal discharge, vaginal itching and burning which has not responded to a 3 day course of monistat.  She reports recently had intercourse with her boyfriend who had been with another partner, who was diagnosed with trichomonas. Pt denies nausea, vomiting, fevers, abdominal pain, increased urinary frequency or hematuria but does have urethral and vaginal burning with urination.  She has found no alleviators. She denies rash or vaginal lesions.     Past Medical History  Diagnosis Date  . Hypothyroidism    Past Surgical History  Procedure Laterality Date  . 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 Knee surgery Right     ACL  . Bmt Bilateral     x2  . Orif ankle fracture Right 01/26/2014    Procedure: OPEN TREATMENT INTERNAL FIXATION RIGHT ANKLE FRACTURE;  Surgeon: Vickki Hearing, MD;  Location: [facility];  Service: Orthopedics;  Laterality: Right;  . Cast application Right 02/16/2014    Procedure: CAST APPLICATION RIGHT ANKLE;  Surgeon: Vickki Hearing, MD;  Location: [facility];  Service: Orthopedics;  Laterality: Right;   History reviewed. No pertinent family history. Social History  Substance Use Topics  . Smoking status: Current Every Day Smoker -- 0.50 packs/day for 1 years    Types: Cigarettes  . Smokeless tobacco: None  . Alcohol Use: No     Comment: occassionally   OB History    No data available     Review of Systems  Constitutional: Negative for fever and chills.  HENT: Negative for congestion and sore throat.   Eyes: Negative.    Respiratory: Negative for chest tightness and shortness of breath.   Cardiovascular: Negative for chest pain.  Gastrointestinal: Negative for nausea, vomiting and abdominal pain.  Genitourinary: Positive for dysuria and vaginal discharge. Negative for urgency, frequency, hematuria and genital sores.  Musculoskeletal: Negative for joint swelling, arthralgias and neck pain.  Skin: Negative.  Negative for rash and wound.  Neurological: Negative for dizziness, weakness, light-headedness, numbness and headaches.  Psychiatric/Behavioral: Negative.       Allergies  Codeine  Home Medications   Prior to Admission medications   Medication Sig Start Date End Date Taking? Authorizing Provider  ibuprofen (ADVIL,MOTRIN) 200 MG tablet Take 400 mg by mouth every 8 (eight) hours as needed.    Historical Provider, MD  levothyroxine (SYNTHROID, LEVOTHROID) 25 MCG tablet Take 25 mcg by mouth daily before breakfast.    Historical Provider, MD  metroNIDAZOLE (FLAGYL) 500 MG tablet Take 1 tablet (500 mg total) by mouth 2 (two) times daily. 01/09/16   Burgess Amor, PA-C  oxyCODONE-acetaminophen (ROXICET) 5-325 MG per tablet Take 1 tablet by mouth every 4 (four) hours as needed for severe pain. 06/28/14   Vickki Hearing, MD  promethazine (PHENERGAN) 12.5 MG tablet Take 1 tablet (12.5 mg total) by mouth every 6 (six) hours as needed for nausea or vomiting. 01/26/14   Vickki Hearing, MD   BP 115/82 mmHg  Pulse 99  Temp(Src) 98 F (36.7 C) (Oral)  Resp  16  Ht 4\' 9"  (1.448 m)  Wt 72.576 kg  BMI 34.61 kg/m2  SpO2 100%  LMP 12/23/2015 Physical Exam  Constitutional: She appears well-developed and well-nourished.  HENT:  Head: Normocephalic and atraumatic.  Eyes: Conjunctivae are normal.  Neck: Normal range of motion.  Cardiovascular: Normal rate, regular rhythm, normal heart sounds and intact distal pulses.   Pulmonary/Chest: Effort normal and breath sounds normal. She has no wheezes.  Abdominal:  Soft. Bowel sounds are normal. She exhibits no distension. There is no tenderness.  Genitourinary: Uterus normal. There is no rash or lesion on the right labia. There is no rash or lesion on the left labia. Uterus is not tender. Cervix exhibits no motion tenderness. Right adnexum displays no tenderness. Left adnexum displays no tenderness. There is erythema in the vagina. Vaginal discharge found.  Musculoskeletal: Normal range of motion.  Neurological: She is alert.  Skin: Skin is warm and dry.  Psychiatric: She has a normal mood and affect.  Nursing note and vitals reviewed.   ED Course  Procedures (including critical care time) Labs Review Labs Reviewed  WET PREP, GENITAL - Abnormal; Notable for the following:    Trich, Wet Prep PRESENT (*)    WBC, Wet Prep HPF POC MANY (*)    All other components within normal limits  URINALYSIS, ROUTINE W REFLEX MICROSCOPIC (NOT AT [facility]) - Abnormal; Notable for the following:    APPearance HAZY (*)    Hgb urine dipstick TRACE (*)    Leukocytes, UA LARGE (*)    All other components within normal limits  URINE MICROSCOPIC-ADD ON - Abnormal; Notable for the following:    Squamous Epithelial / LPF TOO NUMEROUS TO COUNT (*)    Bacteria, UA MANY (*)    All other components within normal limits  URINE CULTURE  POC URINE PREG, ED  GC/CHLAMYDIA PROBE AMP ([place]) NOT AT [facility]    Imaging Review No results found. I have personally reviewed and evaluated these images and lab results as part of my medical decision-making.   EKG Interpretation None      MDM   Final diagnoses:  Trichomonas vaginitis    Labs reviewed, positive for trichomonas. Pt offered HIV/rpr, deferred at this time. She was treated with flagyl, first dose given.  Also discussed pending cultures, offered prophylactic tx which she did accept, given zithromax and rocephin prior to dc. Abstinence x 1 week or until sx resolve. Prn f/u. Referrals given.  The patient appears  reasonably screened and/or stabilized for discharge and I doubt any other medical condition or other [facility] requiring further screening, evaluation, or treatment in the ED at this time prior to discharge.    Burgess AmorJulie Meaghen Vecchiarelli, PA-C 01/11/16 1416  Samuel JesterKathleen McManus, DO 01/13/16 [id number]

## 2016-01-12 ENCOUNTER — Telehealth (HOSPITAL_BASED_OUTPATIENT_CLINIC_OR_DEPARTMENT_OTHER): Payer: Self-pay | Admitting: Emergency Medicine

## 2016-01-12 NOTE — Telephone Encounter (Signed)
Post ED Visit - Positive Culture Follow-up  Culture report reviewed by antimicrobial stewardship pharmacist:  []  Enzo BiNathan Batchelder, Pharm.D. []  Celedonio MiyamotoJeremy Frens, Pharm.D., BCPS []  Garvin FilaMike Maccia, Pharm.D. []  Georgina PillionElizabeth Martin, Pharm.D., BCPS []  Valley GreenMinh Pham, [address]Pharm[address]D., BCPS, AAHIVP []  Estella HuskMichelle Turner, Pharm.D., BCPS, AAHIVP []  Tennis Mustassie Stewart, Pharm.D. []  Sherle Poeob Vincent, [address]Pharm[address]D.  Positive urine culture Treated with metronidazole, organism sensitive to the same and no further patient follow-up is required at this time.  Berle MullMiller, Trachelle Low 01/12/2016, 9:34 AM

## 2016-01-12 NOTE — Progress Notes (Signed)
ED Antimicrobial Stewardship Positive Culture Follow Up   Michele BurdockSherry N Zakrzewski is an [age] y.o. female who [facility] with a chief complaint of  Chief Complaint  Patient presents with  . Vaginal Itching    Recent Results (from the past 720 hour(s))  Wet prep, genital     Status: Abnormal   Collection Time: 01/09/16  4:24 PM  Result Value Ref Range Status   Yeast Wet Prep HPF POC NONE SEEN NONE SEEN Final   Trich, Wet Prep PRESENT (A) NONE SEEN Final   Clue Cells Wet Prep HPF POC NONE SEEN NONE SEEN Final   WBC, Wet Prep HPF POC MANY (A) NONE SEEN Final   Sperm NONE SEEN  Final  Urine culture     Status: None   Collection Time: 01/09/16  5:17 PM  Result Value Ref Range Status   Specimen Description URINE, CLEAN CATCH  Final   Special Requests Normal  Final   Culture   Final    60,000 COLONIES/ml GROUP B STREP(S.AGALACTIAE)ISOLATED TESTING AGAINST S. AGALACTIAE NOT ROUTINELY PERFORMED DUE TO PREDICTABILITY OF AMP/PEN/VAN SUSCEPTIBILITY. [facility]formed [facility]    Report Status 01/11/2016 FINAL  Final    Patient treated for trichomonas and no signs and symptoms of UTI. No treatment indicated. Asymptomatic bacteriuria.  ED Provider: Ailene RudStevie Barrett, PA-C   Sherron MondayAubrey N. Lumi Winslett, PharmD Clinical Pharmacy Resident Pager: [phone] 01/12/2016 8:23 AM

## 2016-03-06 ENCOUNTER — Emergency Department (HOSPITAL_COMMUNITY): Payer: Self-pay

## 2016-03-06 ENCOUNTER — Emergency Department (HOSPITAL_COMMUNITY)
Admission: EM | Admit: 2016-03-06 | Discharge: 2016-03-06 | Disposition: A | Discharge status: Home / Self Care | Payer: Self-pay | Attending: Emergency Medicine | Admitting: Emergency Medicine

## 2016-03-06 ENCOUNTER — Encounter (HOSPITAL_COMMUNITY): Payer: Self-pay | Admitting: Emergency Medicine

## 2016-03-06 DIAGNOSIS — Z79899 Other long term (current) drug therapy: Secondary | ICD-10-CM | POA: Insufficient documentation

## 2016-03-06 DIAGNOSIS — E039 Hypothyroidism, unspecified: Secondary | ICD-10-CM | POA: Insufficient documentation

## 2016-03-06 DIAGNOSIS — F17210 Nicotine dependence, cigarettes, uncomplicated: Secondary | ICD-10-CM | POA: Insufficient documentation

## 2016-03-06 DIAGNOSIS — R7401 Elevation of levels of liver transaminase levels: Secondary | ICD-10-CM

## 2016-03-06 DIAGNOSIS — R109 Unspecified abdominal pain: Secondary | ICD-10-CM

## 2016-03-06 DIAGNOSIS — R740 Nonspecific elevation of levels of transaminase and lactic acid dehydrogenase [LDH]: Secondary | ICD-10-CM

## 2016-03-06 DIAGNOSIS — R1013 Epigastric pain: Secondary | ICD-10-CM | POA: Insufficient documentation

## 2016-03-06 DIAGNOSIS — R112 Nausea with vomiting, unspecified: Secondary | ICD-10-CM | POA: Insufficient documentation

## 2016-03-06 LAB — CBC WITH DIFFERENTIAL/PLATELET
BASOS ABS: 0.1 10*3/uL (ref 0.0–0.1)
BASOS PCT: 1 %
EOS PCT: 2 %
Eosinophils Absolute: 0.2 10*3/uL (ref 0.0–0.7)
HCT: 45.3 % (ref 36.0–46.0)
Hemoglobin: 15.4 g/dL — ABNORMAL HIGH (ref 12.0–15.0)
LYMPHS PCT: 18 %
Lymphs Abs: 2.1 10*3/uL (ref 0.7–4.0)
MCH: 30.1 pg (ref 26.0–34.0)
MCHC: 34 g/dL (ref 30.0–36.0)
MCV: 88.5 fL (ref 78.0–100.0)
Monocytes Absolute: 0.8 10*3/uL (ref 0.1–1.0)
Monocytes Relative: 7 %
Neutro Abs: 8.3 10*3/uL — ABNORMAL HIGH (ref 1.7–7.7)
Neutrophils Relative %: 72 %
PLATELETS: 264 10*3/uL (ref 150–400)
RBC: 5.12 MIL/uL — AB (ref 3.87–5.11)
RDW: 12.8 % (ref 11.5–15.5)
WBC: 11.4 10*3/uL — AB (ref 4.0–10.5)

## 2016-03-06 LAB — URINALYSIS, ROUTINE W REFLEX MICROSCOPIC
Bilirubin Urine: NEGATIVE
Glucose, UA: NEGATIVE mg/dL
Hgb urine dipstick: NEGATIVE
KETONES UR: NEGATIVE mg/dL
LEUKOCYTES UA: NEGATIVE
NITRITE: NEGATIVE
PROTEIN: NEGATIVE mg/dL
Specific Gravity, Urine: 1.01 (ref 1.005–1.030)
pH: 7 (ref 5.0–8.0)

## 2016-03-06 LAB — COMPREHENSIVE METABOLIC PANEL
ALT: 163 U/L — AB (ref 14–54)
AST: 419 U/L — AB (ref 15–41)
Albumin: 4.1 g/dL (ref 3.5–5.0)
Alkaline Phosphatase: 76 U/L (ref 38–126)
Anion gap: 8 (ref 5–15)
BUN: 9 mg/dL (ref 6–20)
CALCIUM: 9.5 mg/dL (ref 8.9–10.3)
CHLORIDE: 101 mmol/L (ref 101–111)
CO2: 29 mmol/L (ref 22–32)
CREATININE: 0.66 mg/dL (ref 0.44–1.00)
Glucose, Bld: 97 mg/dL (ref 65–99)
Potassium: 4.3 mmol/L (ref 3.5–5.1)
Sodium: 138 mmol/L (ref 135–145)
Total Bilirubin: 1.5 mg/dL — ABNORMAL HIGH (ref 0.3–1.2)
Total Protein: 7.9 g/dL (ref 6.5–8.1)

## 2016-03-06 LAB — PREGNANCY, URINE: PREG TEST UR: NEGATIVE

## 2016-03-06 LAB — LIPASE, BLOOD: LIPASE: 36 U/L (ref 11–51)

## 2016-03-06 MED ORDER — ONDANSETRON HCL 4 MG PO TABS: 4.0000 mg | ORAL_TABLET | Freq: Three times a day (TID) | ORAL | Status: DC | PRN

## 2016-03-06 MED ORDER — FAMOTIDINE IN NACL 20-0.9 MG/50ML-% IV SOLN
20.0000 mg | Freq: Once | INTRAVENOUS | Status: AC
  Administered 2016-03-06: 20 mg via INTRAVENOUS
  Filled 2016-03-06: qty 50

## 2016-03-06 MED ORDER — ONDANSETRON HCL 4 MG/2ML IJ SOLN
4.0000 mg | INTRAMUSCULAR | Status: DC | PRN
  Administered 2016-03-06: 4 mg via INTRAVENOUS
  Filled 2016-03-06: qty 2

## 2016-03-06 MED ORDER — MORPHINE SULFATE (PF) 4 MG/ML IV SOLN
4.0000 mg | Freq: Once | INTRAVENOUS | Status: AC
  Administered 2016-03-06: 4 mg via INTRAVENOUS
  Filled 2016-03-06: qty 1

## 2016-03-06 MED ORDER — MORPHINE SULFATE (PF) 2 MG/ML IV SOLN
2.0000 mg | INTRAVENOUS | Status: AC | PRN
  Administered 2016-03-06 (×2): 2 mg via INTRAVENOUS
  Filled 2016-03-06 (×2): qty 1

## 2016-03-06 MED ORDER — DICYCLOMINE HCL 10 MG/ML IM SOLN: 20.0000 mg | Freq: Once | INTRAMUSCULAR | Status: DC

## 2016-03-06 NOTE — Discharge Instructions (Signed)
Eat a bland diet, avoiding greasy, fatty, fried foods, as well as spicy and acidic foods or beverages.  Avoid eating within the hour or 2 before going to bed or laying down.  Also avoid teas, colas, coffee, chocolate, pepermint and spearment.  Take over the counter pepcid, one tablet by mouth twice a day, for the next 2 to 3 weeks.  May also take over the counter maalox/mylanta, as directed on packaging, as needed for discomfort.  Take the prescription as directed. Your liver function tests were elevated today: avoid tylenol, tylenol containing products and alcohol until you are seen in follow up.  Call the GI doctor today to schedule a follow up appointment this week to re-check this finding.  Return to the Emergency Department immediately if worsening.

## 2016-03-06 NOTE — ED Notes (Signed)
Sipping on sprite without difficulty

## 2016-03-06 NOTE — ED Notes (Signed)
Pt states she started having upper abdominal pain wrapping around both sides with vomiting last night.  Vomited about 5 times with no diarrhea.

## 2016-03-06 NOTE — ED Notes (Signed)
Patient able to drink with no problem at this time. RN made aware.

## 2016-03-06 NOTE — ED Notes (Signed)
Patient given a Sprite at this time. 

## 2016-03-06 NOTE — ED Provider Notes (Signed)
CSN: [id number]     Arrival date & time 03/06/16  5621 History   First MD Initiated Contact with Patient 03/06/16 0830     Chief Complaint  Patient presents with  . Abdominal Pain      HPI Pt was seen at 0830.  Per pt, c/o gradual onset and persistence of constant upper abd "pain" since last night approximately 2300. Has been associated with multiple intermittent episodes of N/V.  Describes the abd pain as "burning" and "sharp," with radiation into her back. Last meal approximately 1800 last night. Denies diarrhea, no fevers, no back pain, no rash, no CP/SOB, no black or blood in stools or emesis.      Past Medical History  Diagnosis Date  . Hypothyroidism    Past Surgical History  Procedure Laterality Date  . 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 Knee surgery Right     ACL  . Bmt Bilateral     x2  . Orif ankle fracture Right 01/26/2014    Procedure: OPEN TREATMENT INTERNAL FIXATION RIGHT ANKLE FRACTURE;  Surgeon: Vickki Hearing, MD;  Location: [facility];  Service: Orthopedics;  Laterality: Right;  . Cast application Right 02/16/2014    Procedure: CAST APPLICATION RIGHT ANKLE;  Surgeon: Vickki Hearing, MD;  Location: [facility];  Service: Orthopedics;  Laterality: Right;    Social History  Substance Use Topics  . Smoking status: Current Every Day Smoker -- 0.50 packs/day for 1 years    Types: Cigarettes  . Smokeless tobacco: None  . Alcohol Use: No     Comment: occassionally    Review of Systems ROS: Statement: All systems negative except as marked or noted in the HPI; Constitutional: Negative for fever and chills. ; ; Eyes: Negative for eye pain, redness and discharge. ; ; ENMT: Negative for ear pain, hoarseness, nasal congestion, sinus pressure and sore throat. ; ; Cardiovascular: Negative for chest pain, palpitations, diaphoresis, dyspnea and peripheral edema. ; ; Respiratory: Negative for cough, wheezing and stridor. ; ; Gastrointestinal:  +N/V, abd pain. Negative for diarrhea, blood in stool, hematemesis, jaundice and rectal bleeding. . ; ; Genitourinary: Negative for dysuria, flank pain and hematuria. ; ; Musculoskeletal: Negative for back pain and neck pain. Negative for swelling and trauma.; ; Skin: Negative for pruritus, rash, abrasions, blisters, bruising and skin lesion.; ; Neuro: Negative for headache, lightheadedness and neck stiffness. Negative for weakness, altered level of consciousness , altered mental status, extremity weakness, paresthesias, involuntary movement, seizure and syncope.      Allergies  Codeine  Home Medications   Prior to Admission medications   Medication Sig Start Date End Date Taking? Authorizing Provider  ibuprofen (ADVIL,MOTRIN) 200 MG tablet Take 400 mg by mouth every 8 (eight) hours as needed.    Historical Provider, MD  levothyroxine (SYNTHROID, LEVOTHROID) 25 MCG tablet Take 25 mcg by mouth daily before breakfast.    Historical Provider, MD  metroNIDAZOLE (FLAGYL) 500 MG tablet Take 1 tablet (500 mg total) by mouth 2 (two) times daily. 01/09/16   Burgess Amor, PA-C  oxyCODONE-acetaminophen (ROXICET) 5-325 MG per tablet Take 1 tablet by mouth every 4 (four) hours as needed for severe pain. 06/28/14   Vickki Hearing, MD  promethazine (PHENERGAN) 12.5 MG tablet Take 1 tablet (12.5 mg total) by mouth every 6 (six) hours as needed for nausea or vomiting. 01/26/14   Vickki Hearing, MD   BP 127/92 mmHg  Pulse 78  Temp(Src) 98 F (36.7 C) (Oral)  Resp 18  Ht 4\' 9"  (1.448 m)  Wt 160 lb (72.576 kg)  BMI 34.61 kg/m2  SpO2 100%  LMP 02/22/2016 Physical Exam  0835: Physical examination:  Nursing notes reviewed; Vital signs and O2 SAT reviewed;  Constitutional: Well developed, Well nourished, Well hydrated, In no acute distress; Head:  Normocephalic, atraumatic; Eyes: EOMI, PERRL, No scleral icterus; ENMT: Mouth and pharynx normal, Mucous membranes moist; Neck: Supple, Full range of motion, No  lymphadenopathy; Cardiovascular: Regular rate and rhythm, No murmur, rub, or gallop; Respiratory: Breath sounds clear & equal bilaterally, No rales, rhonchi, wheezes.  Speaking full sentences with ease, Normal respiratory effort/excursion; Chest: Nontender, Movement normal; Abdomen: Soft, +mild mid-epigastric tenderness to palp. No rebound or guarding. Nondistended, Normal bowel sounds; Genitourinary: No CVA tenderness; Extremities: Pulses normal, No tenderness, No edema, No calf edema or asymmetry.; Neuro: AA&Ox3, Major CN grossly intact.  Speech clear. No gross focal motor or sensory deficits in extremities.; Skin: Color normal, Warm, Dry.   ED Course  Procedures (including critical care time) Labs Review  Imaging Review  I have personally reviewed and evaluated these images and lab results as part of my medical decision-making.   EKG Interpretation None      MDM  MDM Reviewed: previous chart, nursing note and vitals Reviewed previous: labs Interpretation: labs, ultrasound and x-ray      Results for orders placed or performed during the hospital encounter of 03/06/16  Comprehensive metabolic panel  Result Value Ref Range   Sodium 138 135 - 145 mmol/L   Potassium 4.3 3.5 - 5.1 mmol/L   Chloride 101 101 - 111 mmol/L   CO2 29 22 - 32 mmol/L   Glucose, Bld 97 65 - 99 mg/dL   BUN 9 6 - 20 mg/dL   Creatinine, Ser 6.380.66 0.44 - 1.00 mg/dL   Calcium 9.5 8.9 - 75.610.3 mg/dL   Total Protein 7.9 6.5 - 8.1 g/dL   Albumin 4.1 3.5 - 5.0 g/dL   AST 433419 (H) 15 - 41 U/L   ALT 163 (H) 14 - 54 U/L   Alkaline Phosphatase 76 38 - 126 U/L   Total Bilirubin 1.5 (H) 0.3 - 1.2 mg/dL   GFR calc non Af Amer >60 >60 mL/min   GFR calc Af Amer >60 >60 mL/min   Anion gap 8 5 - 15  Lipase, blood  Result Value Ref Range   Lipase 36 11 - 51 U/L  CBC with Differential  Result Value Ref Range   WBC 11.4 (H) 4.0 - 10.5 K/uL   RBC 5.12 (H) 3.87 - 5.11 MIL/uL   Hemoglobin 15.4 (H) 12.0 - 15.0 g/dL   HCT  29.545.3 18.836.0 - 41.646.0 %   MCV 88.5 78.0 - 100.0 fL   MCH 30.1 26.0 - 34.0 pg   MCHC 34.0 30.0 - 36.0 g/dL   RDW 60.612.8 30.111.5 - 60.115.5 %   Platelets 264 150 - 400 K/uL   Neutrophils Relative % 72 %   Neutro Abs 8.3 (H) 1.7 - 7.7 K/uL   Lymphocytes Relative 18 %   Lymphs Abs 2.1 0.7 - 4.0 K/uL   Monocytes Relative 7 %   Monocytes Absolute 0.8 0.1 - 1.0 K/uL   Eosinophils Relative 2 %   Eosinophils Absolute 0.2 0.0 - 0.7 K/uL   Basophils Relative 1 %   Basophils Absolute 0.1 0.0 - 0.1 K/uL  Pregnancy, urine  Result Value Ref Range   Preg  Test, Ur NEGATIVE NEGATIVE  Urinalysis, Routine w reflex microscopic  Result Value Ref Range   Color, Urine YELLOW YELLOW   APPearance CLEAR CLEAR   Specific Gravity, Urine 1.010 1.005 - 1.030   pH 7.0 5.0 - 8.0   Glucose, UA NEGATIVE NEGATIVE mg/dL   Hgb urine dipstick NEGATIVE NEGATIVE   Bilirubin Urine NEGATIVE NEGATIVE   Ketones, ur NEGATIVE NEGATIVE mg/dL   Protein, ur NEGATIVE NEGATIVE mg/dL   Nitrite NEGATIVE NEGATIVE   Leukocytes, UA NEGATIVE NEGATIVE   US Abdomen Complete 03/06/2016  CLINICAL DATA:  30 year old with upper abdominal pain, nausea and vomiting for 1 day. EXAM: ABDOMEN ULTRASOUND COMPLETE COMPARISON:  None. FINDINGS: Examination is mildly degraded by body habitus. Gallbladder: No gallstones or wall thickening visualized. There is questionable echogenic bile versus artifact in the gallbladder lumen. No sonographic Murphy sign noted by sonographer. Common bile duct: Diameter: 1.8 mm Liver: Mildly heterogeneous in echotexture with incomplete penetration. No focal lesion observed. IVC: No abnormality visualized. Pancreas: Partly obscured by bowel gas. The visualized portions appear unremarkable. Spleen: Size and appearance within normal limits. Right Kidney: Length: 9.6 cm. Echogenicity within normal limits. No mass or hydronephrosis visualized. Left Kidney: Length: 9.5 cm. Echogenicity within normal limits. No mass or hydronephrosis visualized.  Abdominal aorta: No aneurysm visualized. Other findings: None. IMPRESSION: 1. No demonstrated acute findings. 2. Examination is degraded by body habitus. 3. Mildly heterogeneous hepatic echotexture without demonstrated focal abnormality. Electronically Signed   By: Carey Bullocks M.D.   On: 03/06/2016 09:39   Dg Abd Acute W/chest 03/06/2016  CLINICAL DATA:  Abdominal pain with nausea and vomiting EXAM: DG ABDOMEN ACUTE W/ 1V CHEST COMPARISON:  None. FINDINGS: There is no evidence of dilated bowel loops or free intraperitoneal air. No radiopaque calculi or other significant radiographic abnormality is seen. Heart size and mediastinal contours are within normal limits. Both lungs are clear. IMPRESSION: Negative abdominal radiographs.  No acute cardiopulmonary disease. Electronically Signed   By: Alcide Clever M.D.   On: 03/06/2016 09:35    1215:  Pt has tol PO well while in the ED without N/V.  No stooling while in the ED.  Abd benign, VSS. Feels better and wants to go home now. LFT's elevated from baseline, but [place] without acute findings (d/w Rads MD). Tx symptomatically, f/u PMD or GI MD. Dx and testing d/w pt and family.  Questions answered.  Verb understanding, agreeable to d/c home with outpt f/u.   Samuel Jester, DO 03/11/16 1607

## 2016-07-29 ENCOUNTER — Emergency Department (HOSPITAL_COMMUNITY): Payer: Self-pay

## 2016-07-29 ENCOUNTER — Encounter (HOSPITAL_COMMUNITY): Payer: Self-pay | Admitting: Emergency Medicine

## 2016-07-29 ENCOUNTER — Emergency Department (HOSPITAL_COMMUNITY)
Admission: EM | Admit: 2016-07-29 | Discharge: 2016-07-29 | Disposition: A | Discharge status: Home / Self Care | Payer: Self-pay | Attending: Emergency Medicine | Admitting: Emergency Medicine

## 2016-07-29 DIAGNOSIS — M25571 Pain in right ankle and joints of right foot: Secondary | ICD-10-CM | POA: Insufficient documentation

## 2016-07-29 DIAGNOSIS — E039 Hypothyroidism, unspecified: Secondary | ICD-10-CM | POA: Insufficient documentation

## 2016-07-29 DIAGNOSIS — Z9889 Other specified postprocedural states: Secondary | ICD-10-CM | POA: Insufficient documentation

## 2016-07-29 DIAGNOSIS — F17210 Nicotine dependence, cigarettes, uncomplicated: Secondary | ICD-10-CM | POA: Insufficient documentation

## 2016-07-29 DIAGNOSIS — Z79899 Other long term (current) drug therapy: Secondary | ICD-10-CM | POA: Insufficient documentation

## 2016-07-29 MED ORDER — IBUPROFEN 400 MG PO TABS
600.0000 mg | ORAL_TABLET | Freq: Once | ORAL | Status: AC
  Administered 2016-07-29: 600 mg via ORAL
  Filled 2016-07-29: qty 2

## 2016-07-29 MED ORDER — IBUPROFEN 600 MG PO TABS: 600.0000 mg | ORAL_TABLET | Freq: Four times a day (QID) | ORAL | 0 refills | Status: AC | PRN

## 2016-07-29 MED ORDER — TRAMADOL HCL 50 MG PO TABS: 50.0000 mg | ORAL_TABLET | Freq: Four times a day (QID) | ORAL | 0 refills | Status: DC | PRN

## 2016-07-29 NOTE — ED Triage Notes (Signed)
PT c/o right ankle pain with no new injury. PT states hx of right ankle surgery with hardware. PT able to bear weight.

## 2016-07-29 NOTE — ED Provider Notes (Signed)
[facility] Provider Note   CSN: [id number] Arrival date & time: 07/29/16  1259  By signing my name below, I, Alyssa Grove, attest that this documentation has been prepared under the direction and in the presence of Burgess Amor, [place]. Electronically Signed: Alyssa Grove, ED Scribe. 07/29/16. 3:09 PM.  First MD Initiated Contact with Patient 07/29/16 1411    History   Chief Complaint Chief Complaint  Patient presents with  . Ankle Pain    The history is provided by the patient. No language interpreter was used.    HPI Comments: Michele Fowler is a 30 y.o. female  With a prior history of right ankle reconstruction surgery Jan 2015 due to a horse riding accident who presents to the Emergency Department complaining of constant, sharp, throbbing right ankle pain onset last night. Pt states she stepped out of the truck last night, denies trauma, and had sudden sharp pain and has been throbbing ever since. She states with the onset of the pain it shot up to her right leg, but has not radiated since. She notes associated swelling of her right foot.  The pain is present even when at rest. Pt states she does not have an active lifestyle and has no activity changes within the past week. She states that on a regular morning, she usually has some stiffness that slowly goes away, but states has chronic mild pain since her original injury. She states she has not used any medication for pain relief.   Past Medical History:  Diagnosis Date  . Hypothyroidism     Patient Active Problem List   Diagnosis Date Noted  . Ankle fracture, bimalleolar, closed 01/24/2014    Past Surgical History:  Procedure Laterality Date  . bmt Bilateral    x2  . CAST APPLICATION Right 02/16/2014   Procedure: CAST APPLICATION RIGHT ANKLE;  Surgeon: Vickki Hearing, MD;  Location: [facility];  Service: Orthopedics;  Laterality: Right;  . 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 KNEE  SURGERY Right    ACL  . ORIF ANKLE FRACTURE Right 01/26/2014   Procedure: OPEN TREATMENT INTERNAL FIXATION RIGHT ANKLE FRACTURE;  Surgeon: Vickki Hearing, MD;  Location: [facility];  Service: Orthopedics;  Laterality: Right;    OB History    Gravida Para Term Preterm AB Living   0 0 0 0 0 0   SAB TAB Ectopic Multiple Live Births   0 0 0 0 0       Home Medications    Prior to Admission medications   Medication Sig Start Date End Date Taking? Authorizing Provider  levothyroxine (SYNTHROID, LEVOTHROID) 25 MCG tablet Take 25 mcg by mouth daily before breakfast. Reported on 03/06/2016   Yes Historical Provider, MD  ibuprofen (ADVIL,MOTRIN) 600 MG tablet Take 1 tablet (600 mg total) by mouth every 6 (six) hours as needed. 07/29/16   Burgess Amor, PA-C  ondansetron (ZOFRAN) 4 MG tablet Take 1 tablet (4 mg total) by mouth every 8 (eight) hours as needed for nausea or vomiting. Patient not taking: Reported on 07/29/2016 03/06/16   Samuel Jester, DO  traMADol (ULTRAM) 50 MG tablet Take 1 tablet (50 mg total) by mouth every 6 (six) hours as needed. 07/29/16   Burgess Amor, PA-C    Family History History reviewed. No pertinent family history.  Social History Social History  Substance Use Topics  . Smoking status: Current Every Day Smoker    Packs/day:  0.50    Years: 1.00    Types: Cigarettes  . Smokeless tobacco: Never Used  . Alcohol use No     Comment: occassionally     Allergies   Codeine   Review of Systems Review of Systems  Constitutional: Negative for fatigue.  Musculoskeletal: Positive for arthralgias and joint swelling.    Physical Exam Updated Vital Signs BP 120/79 (BP Location: Left Arm)   Pulse 83   Temp 98.6 F (37 C) (Oral)   Resp 16   Ht 4\' 9"  (1.448 m)   Wt 71.2 kg   LMP 07/28/2016   SpO2 97%   BMI 33.97 kg/m   Physical Exam  Constitutional: She appears well-developed and well-nourished.  HENT:  Head: Normocephalic.  Eyes: Conjunctivae are normal.    Cardiovascular: Normal rate.   Pedal pulses are intact  Pulmonary/Chest: Effort normal. No respiratory distress.  Abdominal: She exhibits no distension.  Musculoskeletal: Normal range of motion.  Tender to palpation along her right medial malleolus through her anterior ankle Subtle edema No erythema No palpable deformities Distal sensation is normal No calf tenderness Compartments are soft  Neurological: She is alert.  Skin: Skin is warm and dry.  Skin intact  Psychiatric: She has a normal mood and affect. Her behavior is normal.  Nursing note and vitals reviewed.    ED Treatments / Results  DIAGNOSTIC STUDIES: Oxygen Saturation is 97% on RA, adequate by my interpretation.    COORDINATION OF CARE: 3:12 PM Discussed treatment plan with pt at bedside which includes Ibuprofen and crutches and pt agreed to plan. Pt was shown scans from [facility]. Discussed follow up with her surgeon, Dr. Romeo Apple if sx persist despite conservative tx.  Labs (all labs ordered are listed, but only abnormal results are displayed) Labs Reviewed - No data to display  EKG  EKG Interpretation None       Radiology  EXAM: RIGHT ANKLE - COMPLETE 3+ VIEW  COMPARISON: 05/12/2014  FINDINGS: Remote ORIF of the right ankle with side plate fixation of the distal fibula and pin fixation of the medial malleolus. Mortise is intact. No acute fracture or subluxation. Small osteophyte along the anterior aspect of the tibia at the tibiotalar joint.  IMPRESSION: Remote ORIF. No evidence for acute abnormality.   Electronically Signed By: Norva Pavlov M.D. On: 07/29/2016 13:40   Procedures Procedures (including critical care time)  Medications Ordered in ED Medications  ibuprofen (ADVIL,MOTRIN) tablet 600 mg (600 mg Oral Given 07/29/16 1529)     Initial Impression / Assessment and Plan / ED Course  I have reviewed the triage vital signs and the nursing notes.  Pertinent  labs & imaging results that were available during my care of the patient were reviewed by me and considered in my medical decision making (see chart for details).  Clinical Course      Final Clinical Impressions(s) / ED Diagnoses   Final diagnoses:  Ankle pain, right    New Prescriptions Discharge Medication List as of 07/29/2016  3:27 PM    START taking these medications   Details  traMADol (ULTRAM) 50 MG tablet Take 1 tablet (50 mg total) by mouth every 6 (six) hours as needed., Starting Mon 07/29/2016, Print       I personally performed the services described in this documentation, which was scribed in my presence. The recorded information has been reviewed and is accurate.    Burgess Amor, PA-C 07/31/16 1540    Vanetta Mulders, MD 08/02/16  0951  

## 2016-07-29 NOTE — Discharge Instructions (Signed)
Use your crutches and apply ice to your ankle throughout the day, elevate as much as possible to help with pain and swelling.  Your xrays do not give [place] the source of your pain as discussed. Call Dr Romeo Apple for a recheck if not improving over the next several days.

## 2017-07-08 IMAGING — US US ABDOMEN COMPLETE
1 series · 14 of 25 positions shown · non-contrast
Comparison: None.

CLINICAL DATA: 29-year-old with upper abdominal pain, nausea and
vomiting for 1 day.

EXAM:
ABDOMEN ULTRASOUND COMPLETE

[Series 1: us abdomen complete · 0.21mm/px · 14 of 107 slices shown]
[im 1/107]
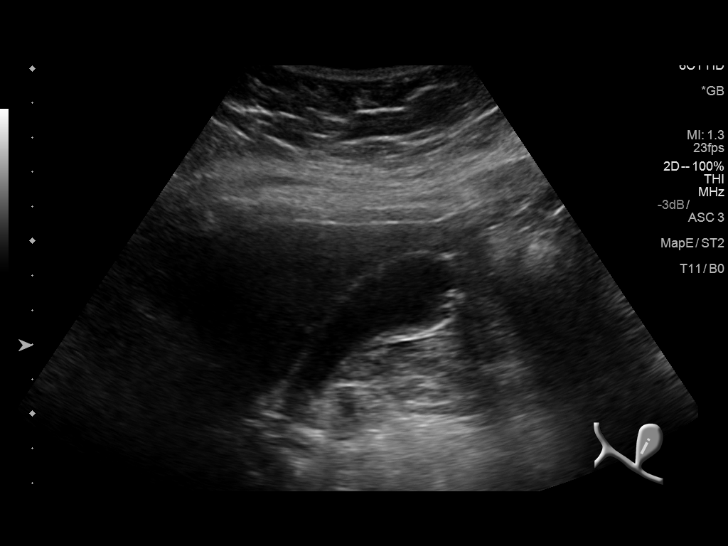
[im 9/107]
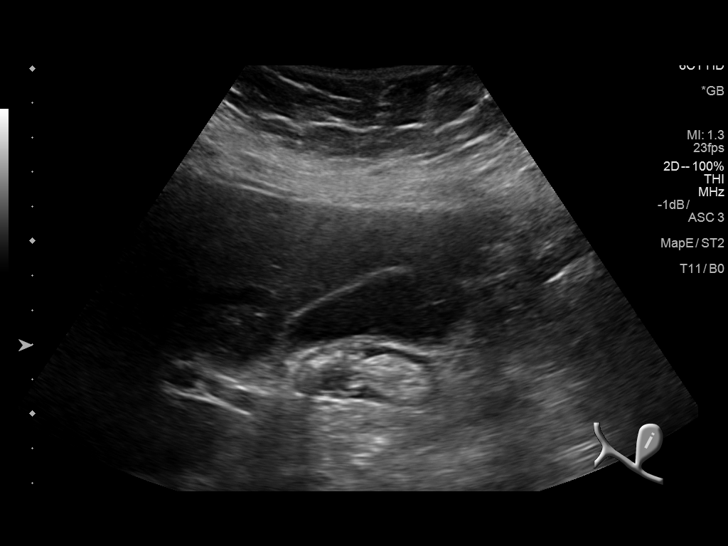
[im 18/107]
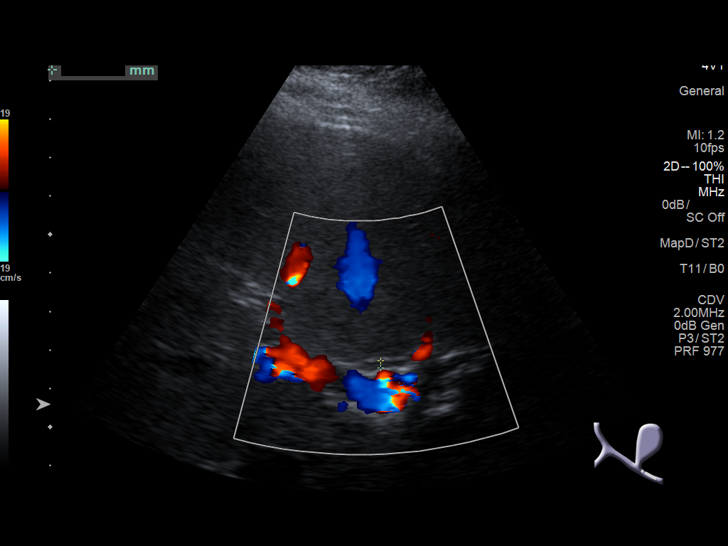
[im 27/107]
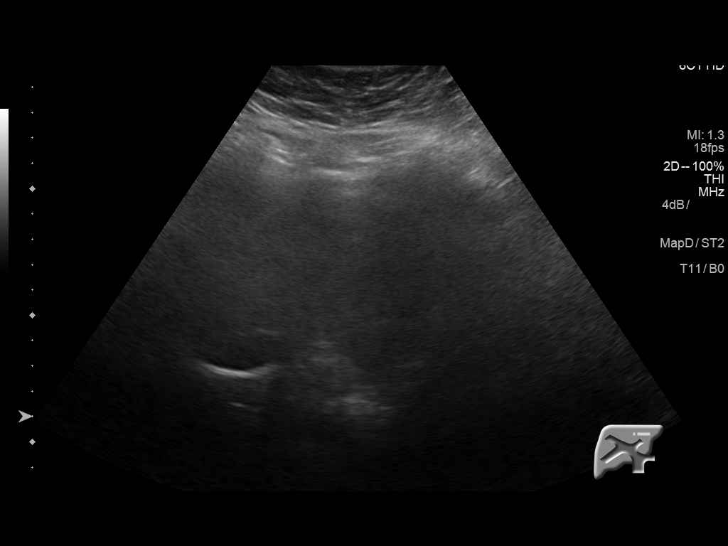
[im 36/107]
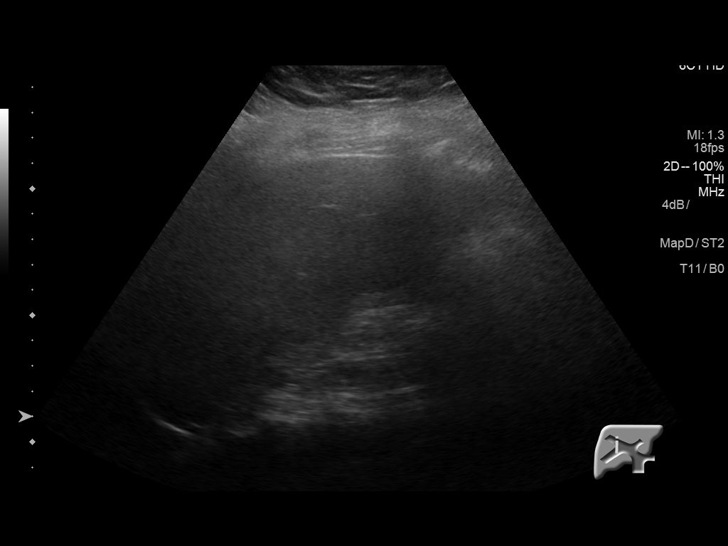
[im 40/107]
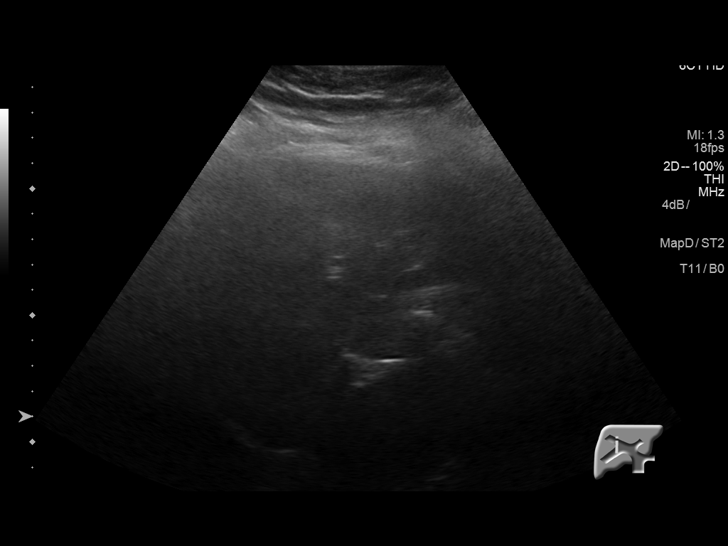
[im 49/107]
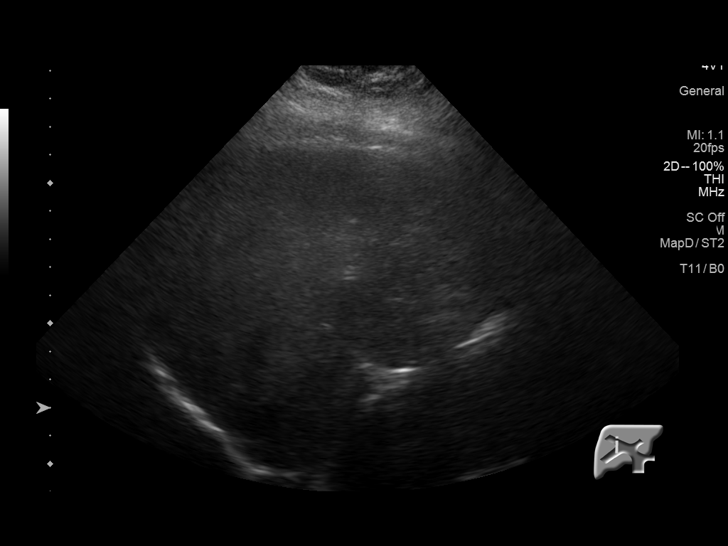
[im 58/107]
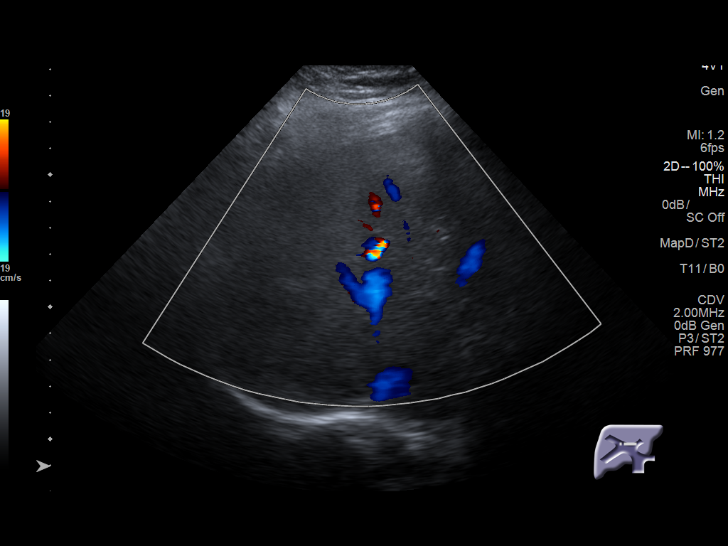
[im 67/107]
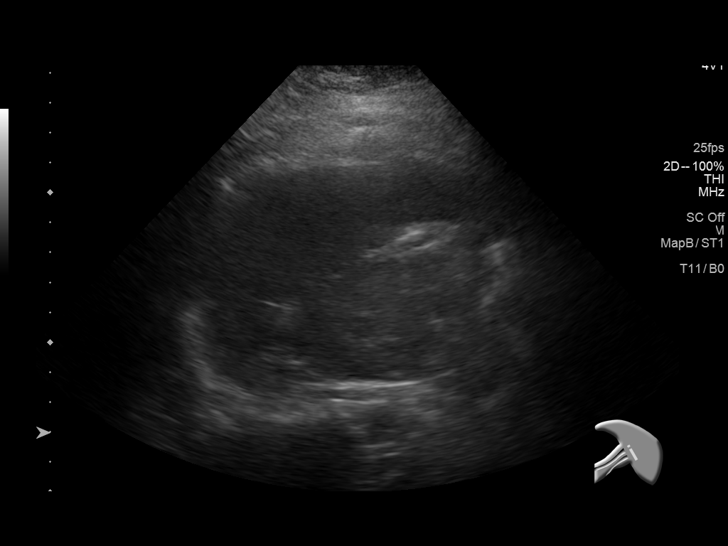
[im 71/107]
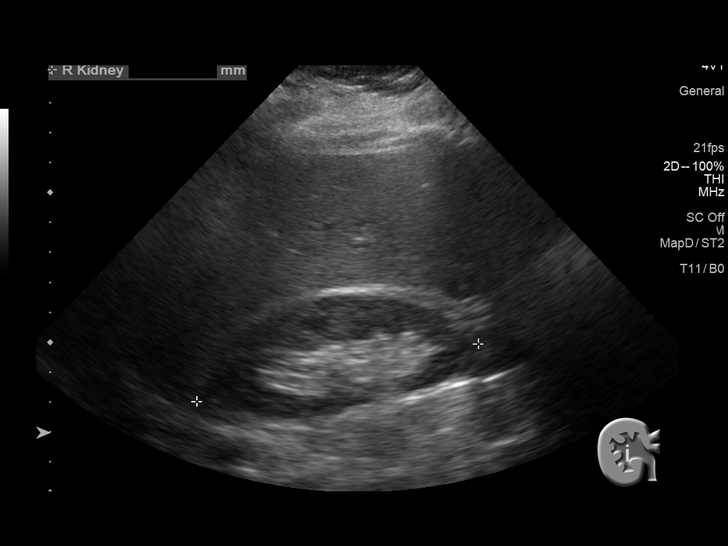
[im 80/107]
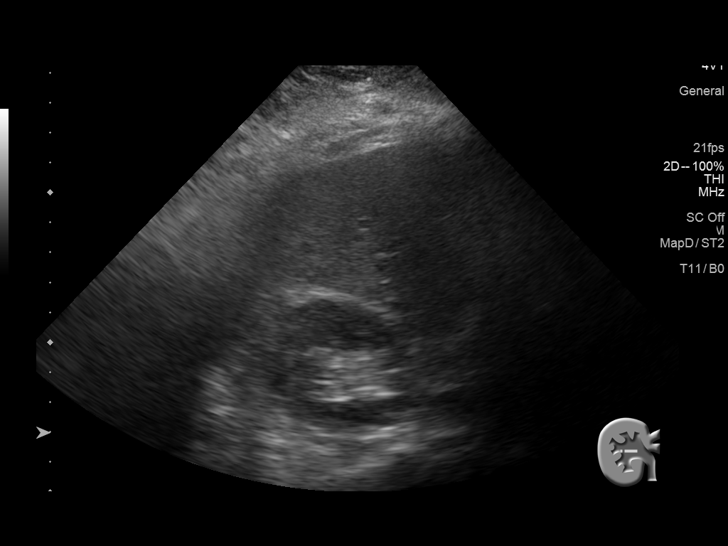
[im 89/107]
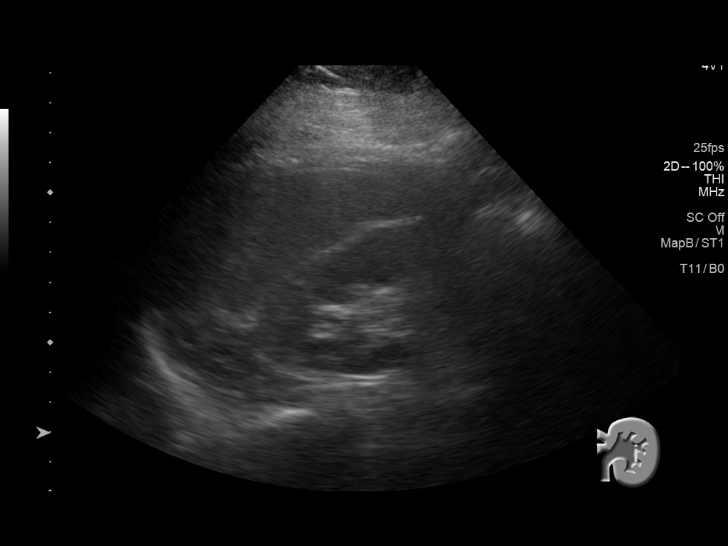
[im 98/107]
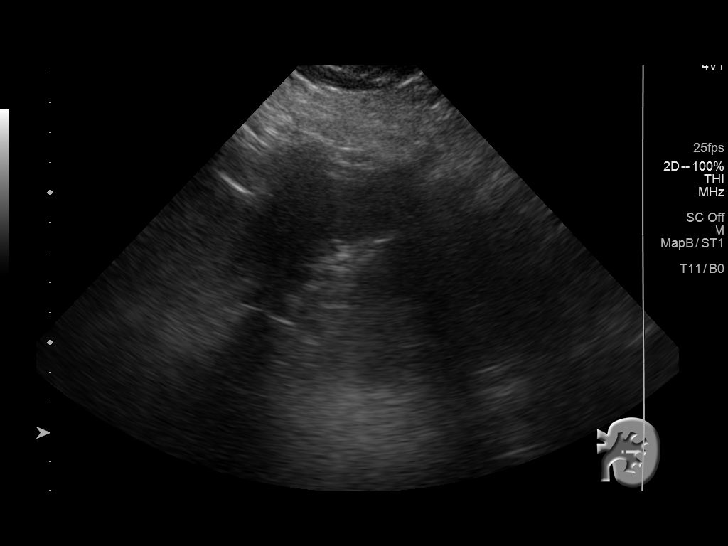
[im 107/107]
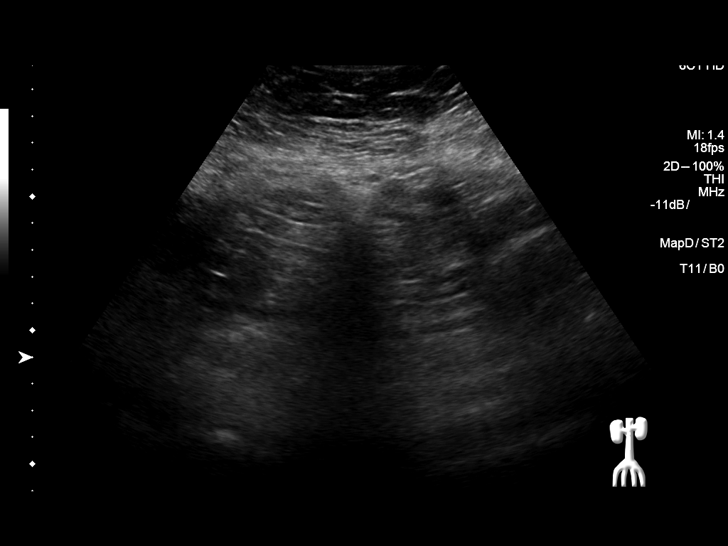

[14 of 25 positions shown; findings below may reference images not displayed]

FINDINGS: Examination is mildly degraded by body habitus.

Gallbladder: No gallstones or wall thickening visualized. There is
questionable echogenic bile versus artifact in the gallbladder
lumen. No sonographic Murphy sign noted by sonographer.

Common bile duct: Diameter: 1.8 mm

Liver: Mildly heterogeneous in echotexture with incomplete
penetration. No focal lesion observed.

IVC: No abnormality visualized.

Pancreas: Partly obscured by bowel gas. The visualized portions
appear unremarkable.

Spleen: Size and appearance within normal limits.

Right Kidney: Length: 9.6 cm. Echogenicity within normal limits. No
mass or hydronephrosis visualized.

Left Kidney: Length: 9.5 cm. Echogenicity within normal limits. No
mass or hydronephrosis visualized.

Abdominal aorta: No aneurysm visualized.

Other findings: None.
IMPRESSION: 1. No demonstrated acute findings.
2. Examination is degraded by body habitus.
3. Mildly heterogeneous hepatic echotexture without demonstrated
focal abnormality.

## 2017-11-06 IMAGING — DX DG ABDOMEN ACUTE W/ 1V CHEST
3 series · 3 of 3 positions shown · non-contrast
Comparison: None.

CLINICAL DATA: Abdominal pain with nausea and vomiting

EXAM:
DG ABDOMEN ACUTE W/ 1V CHEST

[chest pa]
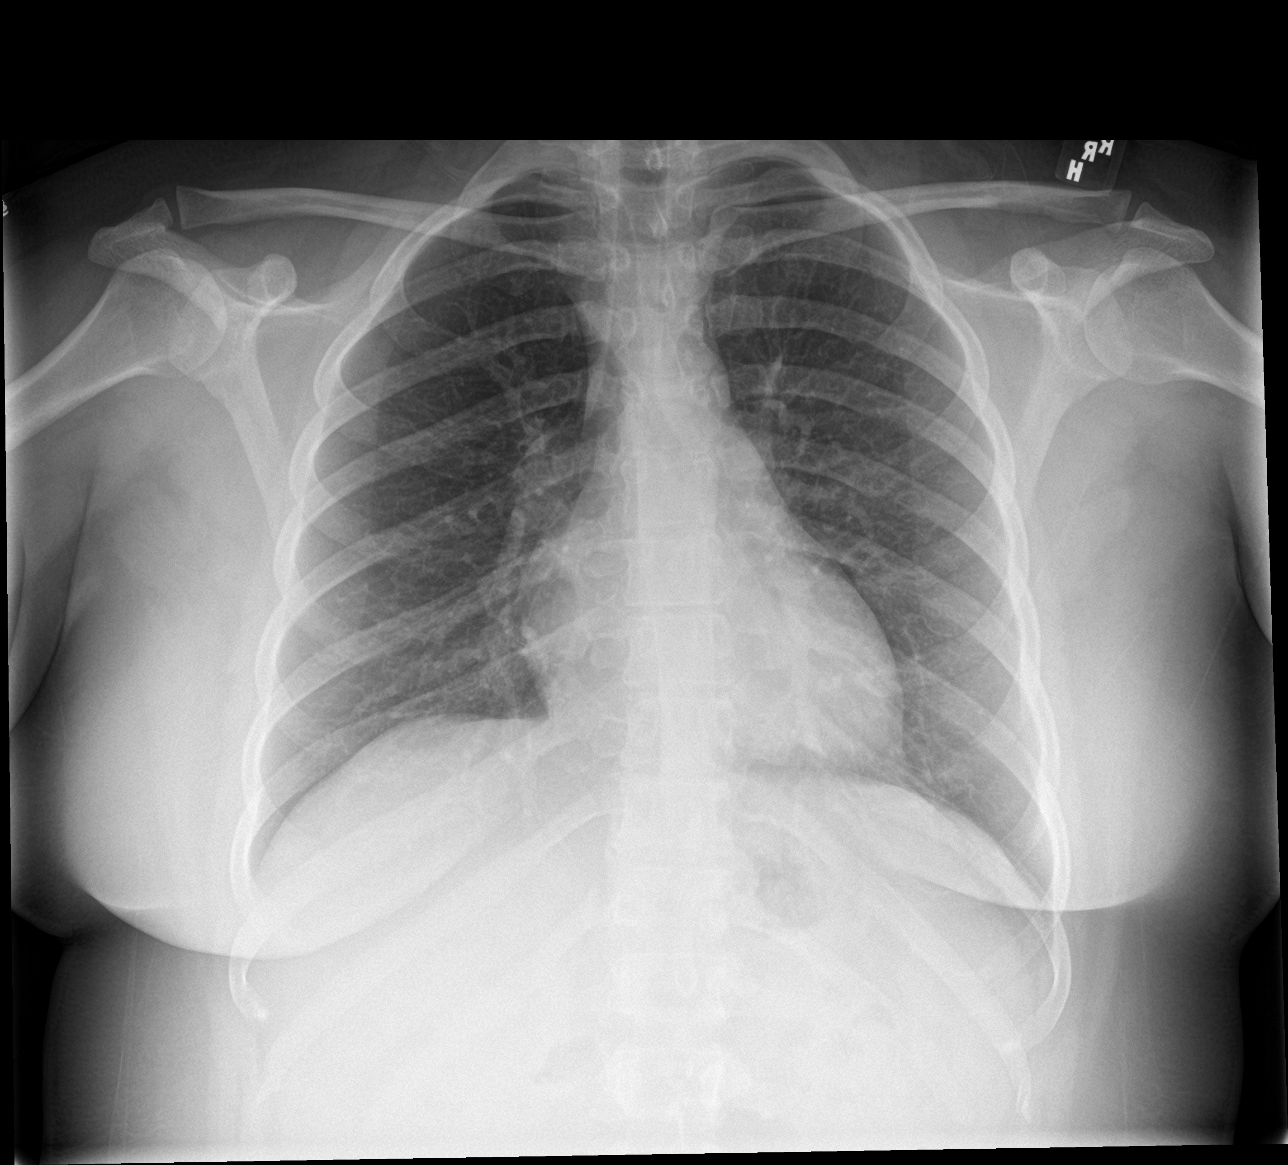

[abdomen erect]
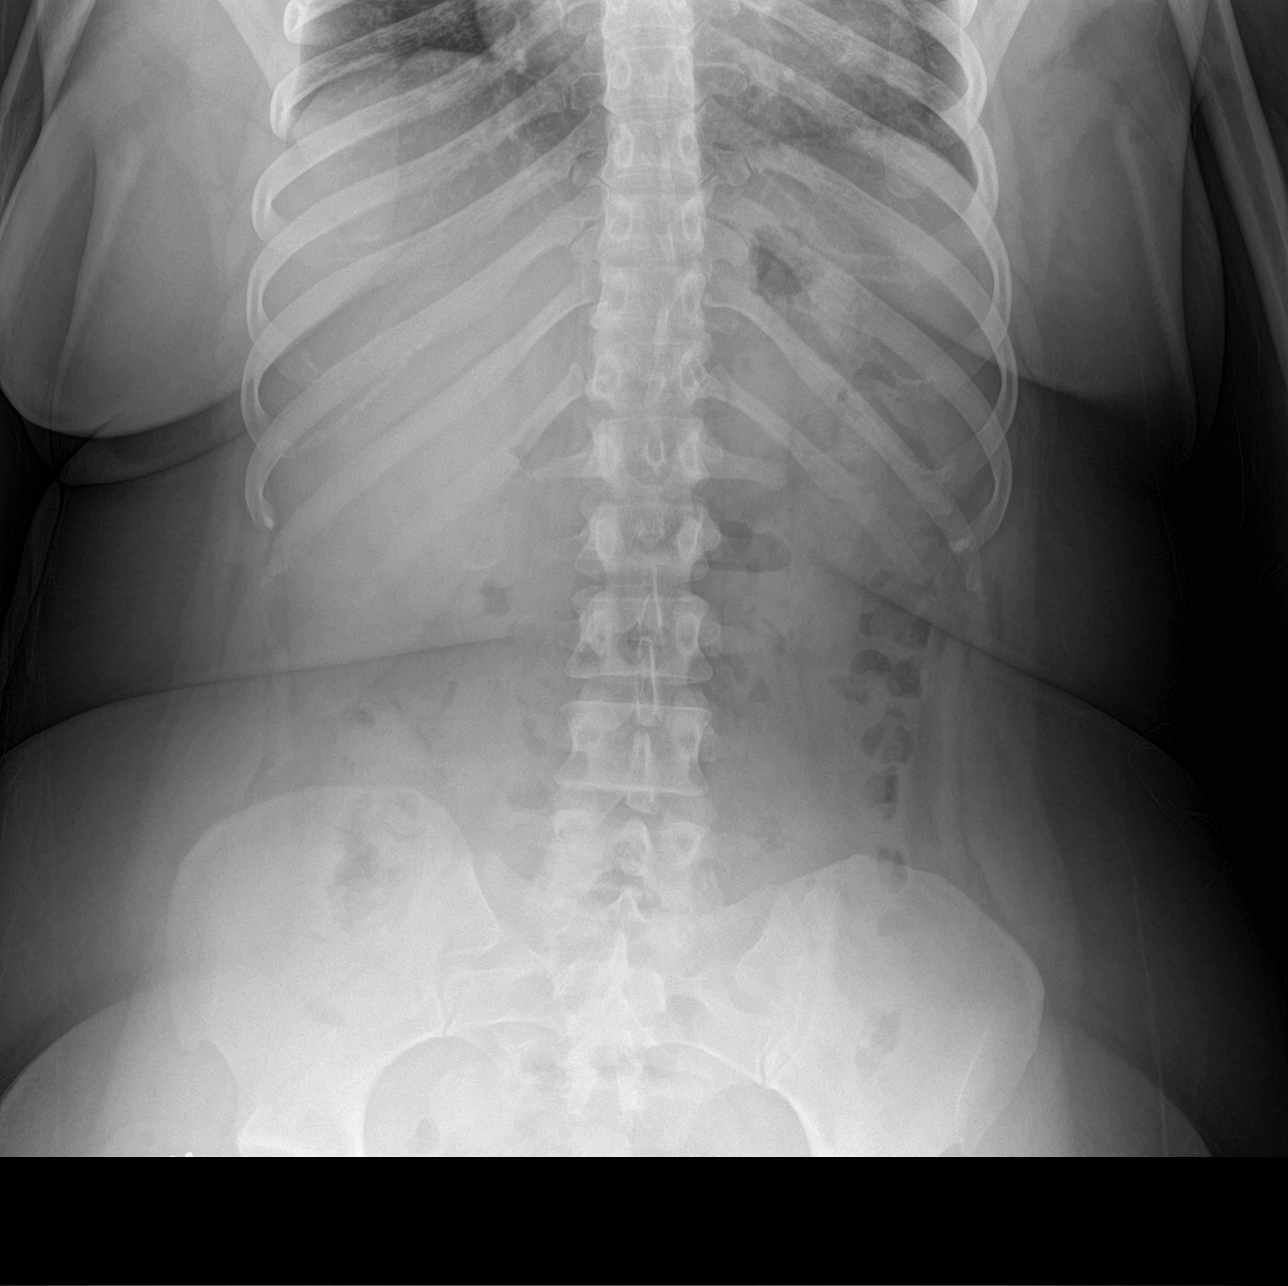

[abdomen supine]
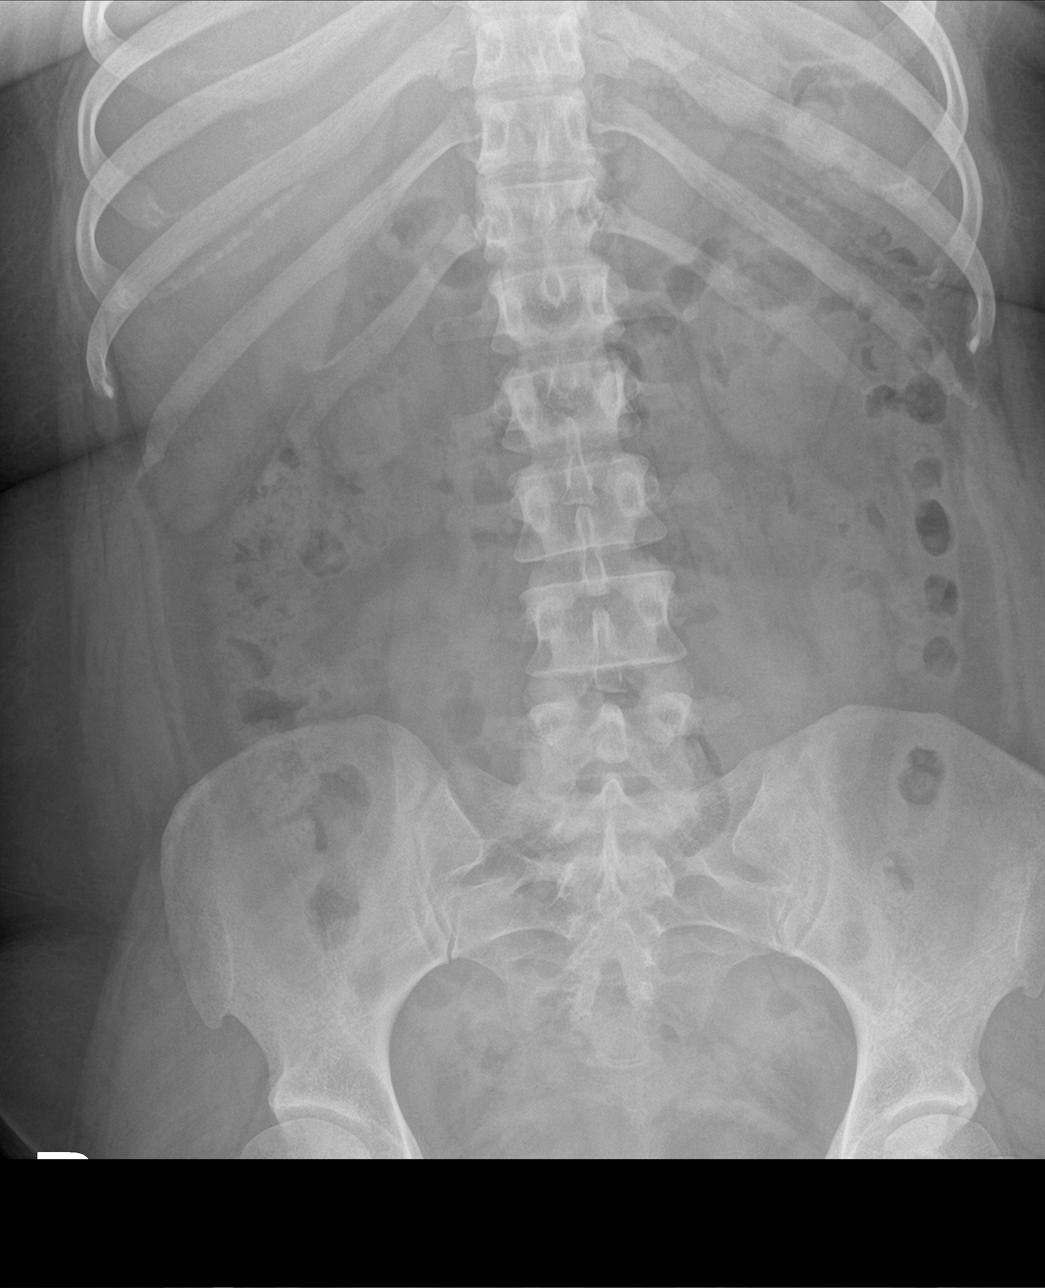

[3 of 3 positions shown; findings below may reference images not displayed]

FINDINGS: There is no evidence of dilated bowel loops or free intraperitoneal
air. No radiopaque calculi or other significant radiographic
abnormality is seen. Heart size and mediastinal contours are within
normal limits. Both lungs are clear.
IMPRESSION: Negative abdominal radiographs.  No acute cardiopulmonary disease.

## 2018-03-31 IMAGING — CR DG ANKLE COMPLETE 3+V*R*
1 series · 3 of 3 positions shown · non-contrast
Comparison: 05/12/2014

CLINICAL DATA: ENTIRE RIGHT ANKLE PAIN, MEDIAL SIDE WORSE, SHOOTING
PAIN WHEN WALKING, THROBBING PAIN WHEN LAYING/SITTING, ALL SINCE
LAST NIGHT, NO KNOW INJURY, HX OF FRACTURE WITH SURGERY 2 YEARS AGO

EXAM:
RIGHT ANKLE - COMPLETE 3+ VIEW

[Series 2: ap · 0.17mm/px · 3 of 3 slices shown]
[im 1/3]
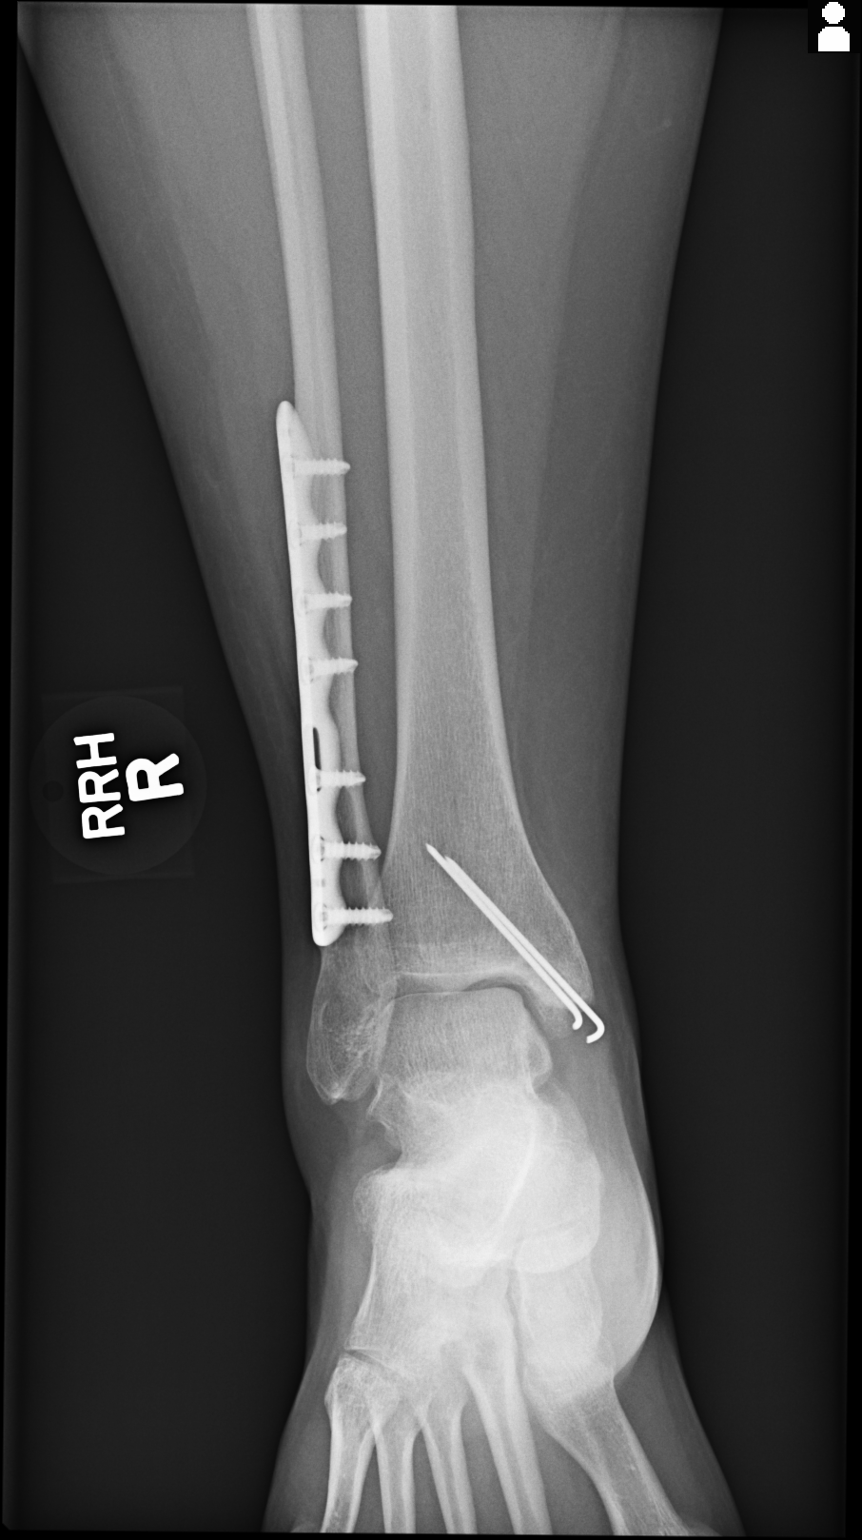
[im 2/3]
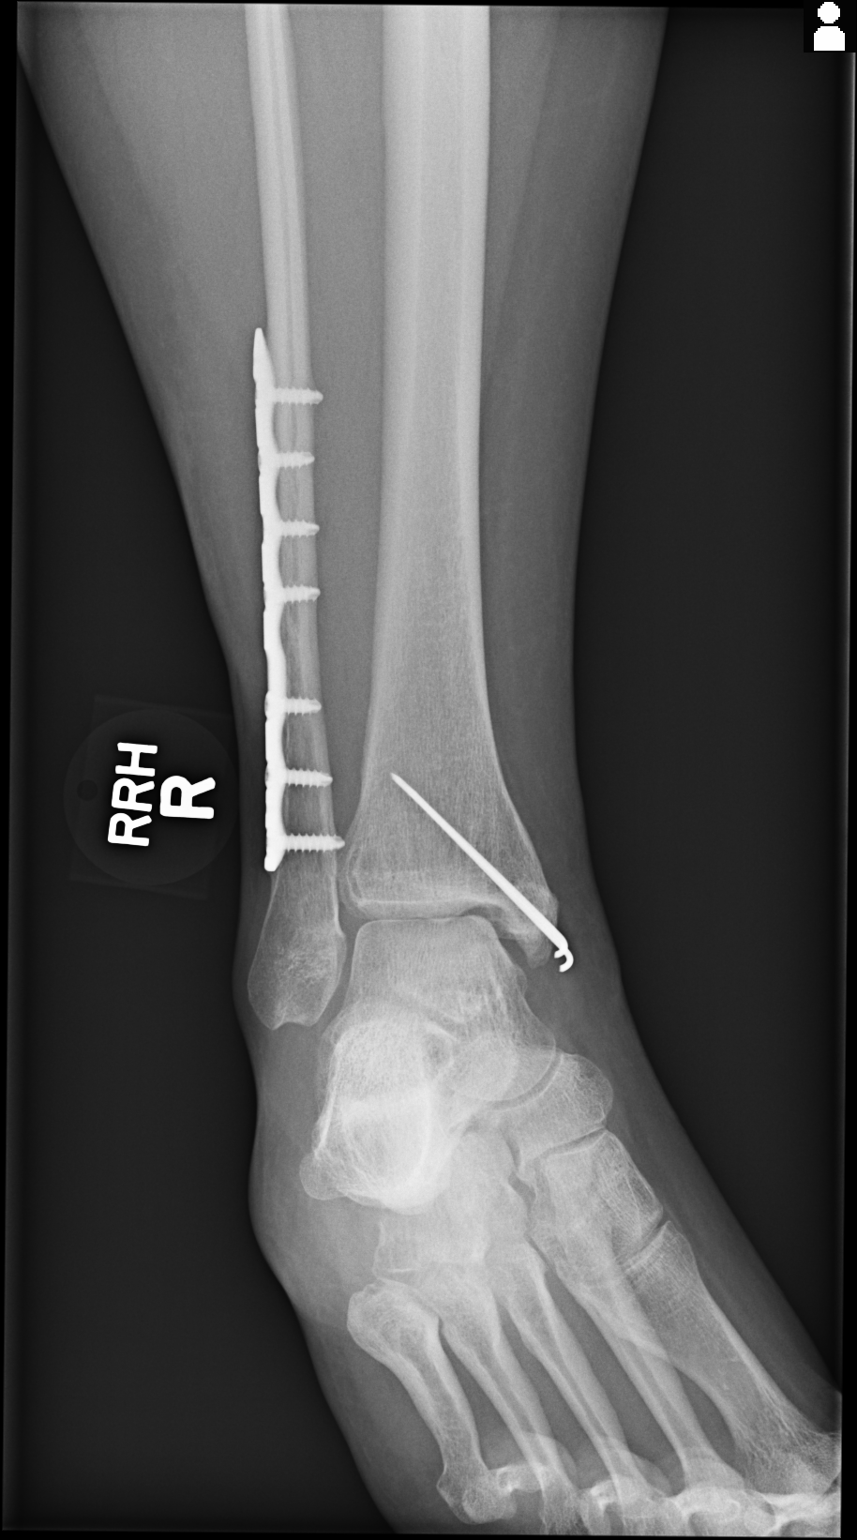
[im 3/3]
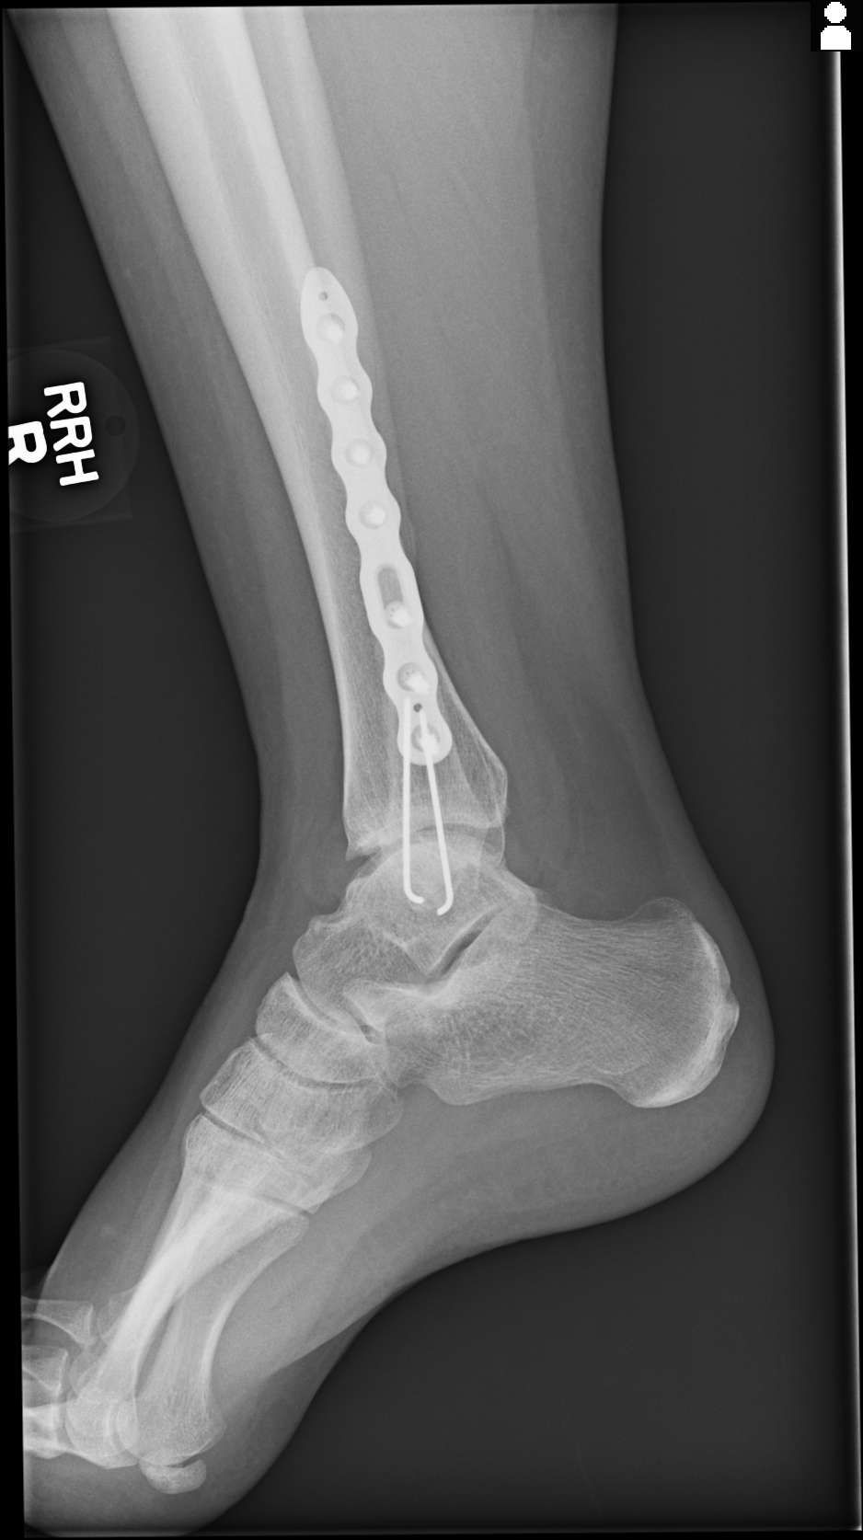

[3 of 3 positions shown; findings below may reference images not displayed]

FINDINGS: Remote ORIF of the right ankle with side plate fixation of the
distal fibula and pin fixation of the medial malleolus. Mortise is
intact. No acute fracture or subluxation. Small osteophyte along the
anterior aspect of the tibia at the tibiotalar joint.
IMPRESSION: Remote ORIF.  No evidence for acute  abnormality.

## 2019-05-04 ENCOUNTER — Other Ambulatory Visit: Payer: Self-pay

## 2019-05-04 ENCOUNTER — Emergency Department (HOSPITAL_COMMUNITY)
Admission: EM | Admit: 2019-05-04 | Discharge: 2019-05-04 | Disposition: A | Discharge status: Home / Self Care | Payer: Self-pay | Attending: Emergency Medicine | Admitting: Emergency Medicine

## 2019-05-04 ENCOUNTER — Encounter (HOSPITAL_COMMUNITY): Payer: Self-pay | Admitting: *Deleted

## 2019-05-04 DIAGNOSIS — F17210 Nicotine dependence, cigarettes, uncomplicated: Secondary | ICD-10-CM | POA: Insufficient documentation

## 2019-05-04 DIAGNOSIS — K0889 Other specified disorders of teeth and supporting structures: Secondary | ICD-10-CM | POA: Insufficient documentation

## 2019-05-04 MED ORDER — PENICILLIN V POTASSIUM 500 MG PO TABS: 500.0000 mg | ORAL_TABLET | Freq: Four times a day (QID) | ORAL | 0 refills | Status: AC

## 2019-05-04 MED ORDER — HYDROCODONE-ACETAMINOPHEN 5-325 MG PO TABS
1.0000 | ORAL_TABLET | Freq: Once | ORAL | Status: AC
  Administered 2019-05-04: 1 via ORAL
  Filled 2019-05-04: qty 1

## 2019-05-04 MED ORDER — CLINDAMYCIN PHOSPHATE 600 MG/50ML IV SOLN
600.0000 mg | Freq: Once | INTRAVENOUS | Status: AC
  Administered 2019-05-04: 600 mg via INTRAVENOUS
  Filled 2019-05-04: qty 50

## 2019-05-04 MED ORDER — HYDROCODONE-ACETAMINOPHEN 5-325 MG PO TABS: 1.0000 | ORAL_TABLET | Freq: Four times a day (QID) | ORAL | 0 refills | Status: DC | PRN

## 2019-05-04 NOTE — Discharge Instructions (Signed)
Call Dr. Anabel Halon office in the morning.  You need to be seen today or the next day for this infection.  Tell them that you are in the emergency department

## 2019-05-04 NOTE — ED Triage Notes (Signed)
Pt c/o pain and swelling to left lower jaw; pt states she has a broken tooth and tonight when she woke up she was having pain with redness

## 2019-05-04 NOTE — ED Provider Notes (Signed)
[facility] EMERGENCY DEPARTMENT Provider Note   CSN: [id number] Arrival date & time: 05/04/19  0054    History   Chief Complaint Chief Complaint  Patient presents with  . Dental Pain    HPI Michele Fowler is a 33 y.o. female.     Patient complains of a toothache and facial swelling  The history is provided by the patient. No language interpreter was used.  Dental Pain  Location:  Upper Upper teeth location: Left upper. Quality:  Aching Severity:  Moderate Timing:  Constant Progression:  Worsening Chronicity:  New Context: abscess   Associated symptoms: no congestion and no headaches     Past Medical History:  Diagnosis Date  . Hypothyroidism     Patient Active Problem List   Diagnosis Date Noted  . Ankle fracture, bimalleolar, closed 01/24/2014    Past Surgical History:  Procedure Laterality Date  . ADENOIDECTOMY AND MYRINGOTOMY WITH TUBE PLACEMENT    . bmt Bilateral    x2  . CAST APPLICATION Right 02/16/2014   Procedure: CAST APPLICATION RIGHT ANKLE;  Surgeon: Vickki Hearing, MD;  Location: [facility];  Service: Orthopedics;  Laterality: Right;  . 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 KNEE SURGERY Right    ACL  . ORIF ANKLE FRACTURE Right 01/26/2014   Procedure: OPEN TREATMENT INTERNAL FIXATION RIGHT ANKLE FRACTURE;  Surgeon: Vickki Hearing, MD;  Location: [facility];  Service: Orthopedics;  Laterality: Right;     OB History    Gravida  0   Para  0   Term  0   Preterm  0   AB  0   Living  0     SAB  0   TAB  0   Ectopic  0   Multiple  0   Live Births  0            Home Medications    Prior to Admission medications   Medication Sig Start Date End Date Taking? Authorizing Provider  HYDROcodone-acetaminophen (NORCO/VICODIN) 5-325 MG tablet Take 1 tablet by mouth every 6 (six) hours as needed for moderate pain. 05/04/19   Bethann Berkshire, MD  ibuprofen (ADVIL,MOTRIN) 600 MG tablet Take 1 tablet  (600 mg total) by mouth every 6 (six) hours as needed. 07/29/16   Burgess Amor, PA-C  levothyroxine (SYNTHROID, LEVOTHROID) 25 MCG tablet Take 25 mcg by mouth daily before breakfast. Reported on 03/06/2016    [provider]  ondansetron (ZOFRAN) 4 MG tablet Take 1 tablet (4 mg total) by mouth every 8 (eight) hours as needed for nausea or vomiting. Patient not taking: Reported on 07/29/2016 03/06/16   Samuel Jester, DO  penicillin v potassium (VEETID) 500 MG tablet Take 1 tablet (500 mg total) by mouth 4 (four) times daily for 7 days. 05/04/19 05/11/19  Bethann Berkshire, MD  traMADol (ULTRAM) 50 MG tablet Take 1 tablet (50 mg total) by mouth every 6 (six) hours as needed. 07/29/16   Burgess Amor, PA-C    Family History History reviewed. No pertinent family history.  Social History Social History   Tobacco Use  . Smoking status: Current Every Day Smoker    Packs/day: 0.30    Years: 1.00    Pack years: 0.30    Types: Cigarettes  . Smokeless tobacco: Never Used  Substance Use Topics  . Alcohol use: No    Comment: occassionally  . Drug use: No  Allergies   Codeine   Review of Systems Review of Systems  Constitutional: Negative for appetite change and fatigue.  HENT: Negative for congestion, ear discharge and sinus pressure.        Toothache  Eyes: Negative for discharge.  Respiratory: Negative for cough.   Cardiovascular: Negative for chest pain.  Gastrointestinal: Negative for abdominal pain and diarrhea.  Genitourinary: Negative for frequency and hematuria.  Musculoskeletal: Negative for back pain.  Skin: Negative for rash.  Neurological: Negative for seizures and headaches.  Psychiatric/Behavioral: Negative for hallucinations.     Physical Exam Updated Vital Signs BP (!) 127/92 (BP Location: Left Arm)   Pulse (!) 119   Temp 98.9 F (37.2 C) (Oral)   Resp 18   Ht 4\' 9"  (1.448 m)   Wt 72.1 kg   LMP 04/12/2019   SpO2 97%   BMI 34.41 kg/m   Physical Exam  Constitutional:      Appearance: She is well-developed.  HENT:     Head: Normocephalic.     Nose: Nose normal.     Mouth/Throat:     Comments: Abscess left upper molar.  Patient has some trismus and swelling to left face Eyes:     General: No scleral icterus.    Conjunctiva/sclera: Conjunctivae normal.  Neck:     Musculoskeletal: Neck supple.     Thyroid: No thyromegaly.  Cardiovascular:     Rate and Rhythm: Normal rate and regular rhythm.     Heart sounds: No murmur. No friction rub. No gallop.   Pulmonary:     Breath sounds: No stridor. No wheezing or rales.  Chest:     Chest wall: No tenderness.  Abdominal:     General: There is no distension.     Tenderness: There is no abdominal tenderness. There is no rebound.  Musculoskeletal: Normal range of motion.  Lymphadenopathy:     Cervical: No cervical adenopathy.  Skin:    Findings: No erythema or rash.  Neurological:     Mental Status: She is oriented to person, place, and time.     Motor: No abnormal muscle tone.     Coordination: Coordination normal.  Psychiatric:        Behavior: Behavior normal.      ED Treatments / Results  Labs (all labs ordered are listed, but only abnormal results are displayed) Labs Reviewed - No data to display  EKG None  Radiology No results found.  Procedures Procedures (including critical care time)  Medications Ordered in ED Medications  clindamycin (CLEOCIN) IVPB 600 mg (600 mg Intravenous New Bag/Given 05/04/19 0212)  HYDROcodone-acetaminophen (NORCO/VICODIN) 5-325 MG per tablet 1 tablet (1 tablet Oral Given 05/04/19 0211)     Initial Impression / Assessment and Plan / ED Course  I have reviewed the triage vital signs and the nursing notes.  Pertinent labs & imaging results that were available during my care of the patient were reviewed by me and considered in my medical decision making (see chart for details).        Patient has tooth abscess with some trismus.  She is  given Cleocin IV and penicillin and Vicodin p.o. and is told to call the oral surgeon in the morning for follow-up today  Final Clinical Impressions(s) / ED Diagnoses   Final diagnoses:  Pain, dental    ED Discharge Orders         Ordered    penicillin v potassium (VEETID) 500 MG tablet  4 times daily  05/04/19 0222    HYDROcodone-acetaminophen (NORCO/VICODIN) 5-325 MG tablet  Every 6 hours PRN     05/04/19 Larina Bras, MD 05/04/19 [id number]

## 2022-07-08 ENCOUNTER — Ambulatory Visit: Payer: Self-pay | Admitting: Orthopedic Surgery

## 2022-08-05 ENCOUNTER — Ambulatory Visit (INDEPENDENT_AMBULATORY_CARE_PROVIDER_SITE_OTHER): Payer: Self-pay | Admitting: Orthopedic Surgery

## 2022-08-05 ENCOUNTER — Encounter: Payer: Self-pay | Admitting: Orthopedic Surgery

## 2022-08-05 VITALS — BP 126/86 | HR 93 | Ht <= 58 in | Wt 175.6 lb

## 2022-08-05 DIAGNOSIS — M25471 Effusion, right ankle: Secondary | ICD-10-CM

## 2022-08-05 DIAGNOSIS — S82841S Displaced bimalleolar fracture of right lower leg, sequela: Secondary | ICD-10-CM

## 2022-08-05 MED ORDER — PREDNISONE 10 MG (48) PO TBPK: ORAL_TABLET | Freq: Every day | ORAL | 0 refills | Status: DC

## 2022-08-05 NOTE — Progress Notes (Signed)
Chief Complaint  Patient presents with   New Patient (Initial Visit)   Ankle Pain    RT ankle/ has disc of xray HX of previous surgery RT ankle [place] [id number]    HPI: 36 year old female had ORIF of the right ankle 8 years ago did well.  About 3 months ago she started having pain and swelling in the right ankle which became severe over the last month.  She went to [facility] for x-rays today showed a ORIF of the right ankle bimalleolar fixation without any changes or hardware complications  Patient says her ankle swelled significantly by the end of the day after she has been working.  She has pain anteriorly and medially but more swelling laterally  Past Medical History:  Diagnosis Date   Hypothyroidism     BP 126/86   Pulse 93   Ht 4\' 9"  (1.448 m)   Wt 175 lb 9.6 oz (79.7 kg)   BMI 38.00 kg/m    General appearance: Well-developed well-nourished no gross deformities  Cardiovascular normal pulse and perfusion normal color without edema  Neurologically no sensation loss or deficits or pathologic reflexes  Psychological: Awake alert and oriented x3 mood and affect normal  Skin no lacerations or ulcerations no nodularity no palpable masses, no erythema or nodularity  Musculoskeletal: Gait is notable for limping.  She has swelling laterally near the lateral malleolus medial and lateral skin incisions are normal.  The only areas of tenderness are anteriorly over the anteromedial ankle joint and then posterior to the medial malleolus and in the posterior tibial tendon.  Imaging imaging was from [facility] 3 views right ankle 2 K wires are medially a lateral plate no evidence of hardware complication fractures look like they healed well.  No degenerative changes of any significance.  A/P  Currently unexplained ankle swelling and edema right leg status post ORIF right ankle 8 years ago 3 months of pain worse over the last month seems to be somewhat dependent although ankle stay  swollen all the time  Recommend cam walker but should get over-the-counter as she is uninsured.  We recommended a short cam walker from [facility]  Prednisone  TED hose and recheck in 2 weeks

## 2022-08-05 NOTE — Patient Instructions (Signed)
Wear the TED hose  Wear the boot for 2 weeks  Take the prednisone for 2 weeks

## 2022-08-22 ENCOUNTER — Encounter: Payer: Self-pay | Admitting: Orthopedic Surgery

## 2022-08-22 ENCOUNTER — Ambulatory Visit (INDEPENDENT_AMBULATORY_CARE_PROVIDER_SITE_OTHER): Payer: Self-pay | Admitting: Orthopedic Surgery

## 2022-08-22 DIAGNOSIS — M76821 Posterior tibial tendinitis, right leg: Secondary | ICD-10-CM

## 2022-08-22 DIAGNOSIS — M25571 Pain in right ankle and joints of right foot: Secondary | ICD-10-CM

## 2022-08-22 NOTE — Progress Notes (Signed)
Chief Complaint  Patient presents with   Ankle Pain    Right     Encounter Diagnoses  Name Primary?   Pain in right ankle and joints of right foot Yes   Posterior tibial tendinitis, right leg     There is no height or weight on file to calculate BMI.  36 year old female with ORIF of the right ankle 8 years ago did well about 3 months ago started having pain and swelling in the right ankle which became severe requiring emergency room visit at [facility].  X-rays were done ORIF of the right bimalleolar fracture showed no hardware issues and no arthritis  Exam in the office revealed swelling of the right foot and ankle possible posterior tibial tendon dysfunction  Patient was advised to wear compression stocking which helped with the swelling.  She was able to wear the boot which helped with the walking but she says the pain is not any better posterior to the medial malleolus  This area is still tender and swollen  Recommend MRI to evaluate the posterior tibial tendon  Otherwise continue with the cam walker

## 2022-08-22 NOTE — Patient Instructions (Signed)
for MRI please go ahead and call to schedule your appointment with [facility] within at least one (1) week.   Central Scheduling [phone]

## 2022-09-10 ENCOUNTER — Ambulatory Visit (HOSPITAL_COMMUNITY): Admission: RE | Admit: 2022-09-10 | Payer: Self-pay | Source: Ambulatory Visit

## 2022-09-30 ENCOUNTER — Ambulatory Visit (HOSPITAL_COMMUNITY)
Admission: RE | Admit: 2022-09-30 | Discharge: 2022-09-30 | Disposition: A | Discharge status: Home / Self Care | Payer: Self-pay | Source: Ambulatory Visit | Attending: Orthopedic Surgery | Admitting: Orthopedic Surgery

## 2022-09-30 DIAGNOSIS — M76821 Posterior tibial tendinitis, right leg: Secondary | ICD-10-CM | POA: Insufficient documentation

## 2022-09-30 DIAGNOSIS — M25571 Pain in right ankle and joints of right foot: Secondary | ICD-10-CM | POA: Insufficient documentation

## 2022-10-07 ENCOUNTER — Telehealth: Payer: Self-pay | Admitting: Orthopedic Surgery

## 2022-10-07 ENCOUNTER — Ambulatory Visit (INDEPENDENT_AMBULATORY_CARE_PROVIDER_SITE_OTHER): Payer: Self-pay | Admitting: Orthopedic Surgery

## 2022-10-07 DIAGNOSIS — M19071 Primary osteoarthritis, right ankle and foot: Secondary | ICD-10-CM

## 2022-10-07 DIAGNOSIS — M19171 Post-traumatic osteoarthritis, right ankle and foot: Secondary | ICD-10-CM

## 2022-10-07 NOTE — Telephone Encounter (Signed)
Ok I did it already

## 2022-10-07 NOTE — Progress Notes (Unsigned)
Virtual Visit via Telephone Note  I connected with Michele Fowler on 10/07/22 at  2:20 PM EDT by telephone and verified that I am speaking with the correct person using two identifiers.  Location: Patient: Patient is at home Provider: Providers in the office   I discussed the limitations, risks, security and privacy concerns of performing an evaluation and management service by telephone and the availability of in person appointments. I also discussed with the patient that there may be a patient responsible charge related to this service. The patient expressed understanding and agreed to proceed.   History of Present Illness:  Michele Fowler is still having pain and swelling around her foot and ankle  As we previously noted:   36 year old female with ORIF of the right ankle 8 years ago did well about 3 months ago started having pain and swelling in the right ankle which became severe requiring emergency room visit at [place] were done ORIF of the right bimalleolar fracture showed no hardware issues and no arthritis   Exam in the office revealed swelling of the right foot and ankle possible posterior tibial tendon dysfunction   Patient was advised to wear compression stocking which helped with the swelling.  She was able to wear the boot which helped with the walking but she says the pain is not any better posterior to the medial malleolus   This area is still tender and swollen   Observations/Objective:  I looked at her MRI she has arthritis in the tibiotalar and subtalar joint with some tenosynovitis of the peroneal tendons and then she has the equivalent of stress fractures with marrow edema in the subtalar joint and tibiotalar joint  At this point she is too young for replacement of the ankle or subtalar fusion so I would recommend she get a double upright orthosis for her normal shoewear and take NSAIDs for pain and arthritis   IMPRESSION: 1. 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 Small tibiotalar joint effusion with capsular thickening. Prominent osteophyte formation and periarticular soft tissue edema may be related to both anterior and posterior ankle impingement. 2. Moderate-severe degenerative changes at the posterior subtalar joint. 3. Reactive subchondral marrow edema centered at the posterior subtalar joint and anterior aspect of the tibiotalar joint, likely reactive. 4. Mild peroneal tenosynovitis.     Electronically Signed   By: Davina Poke D.O.   On: 10/03/2022 08:35    Assessment and Plan:   Follow Up Instructions:    I discussed the assessment and treatment plan with the patient. The patient was provided an opportunity to ask questions and all were answered. The patient agreed with the plan and demonstrated an understanding of the instructions.   The patient was advised to call back or seek an in-person evaluation if the symptoms worsen or if the condition fails to improve as anticipated.  I provided 10 minutes of non-face-to-face time during this encounter.   Arther Abbott, MD

## 2022-10-07 NOTE — Telephone Encounter (Signed)
Patient called to request that her 2:20pm appointment today, 10/07/22, for review of MRI results be done virtually, due to her transportation falling through. Please advise.

## 2022-10-08 ENCOUNTER — Telehealth: Payer: Self-pay | Admitting: Radiology

## 2022-10-08 NOTE — Telephone Encounter (Signed)
-----   Message from Carole Civil, MD sent at 10/07/2022  4:57 PM EDT ----- I need to order a dbl upright orthosis from [employer]

## 2022-10-08 NOTE — Telephone Encounter (Signed)
Voice mail not set up no answer / order is at front desk

## 2022-10-18 ENCOUNTER — Telehealth: Payer: Self-pay | Admitting: Orthopedic Surgery

## 2022-10-18 NOTE — Telephone Encounter (Signed)
Patient called to relay she has scheduled an appointment with [facility] for 10/24/22, at 10:00AM; states she has not been able to get here to pick up the prescription which [facility] needs.  We mailed a copy also on 10/08/22 and patient said it has not reached her.  Will come by before this date to get it (said may have to have parent come as they live in [place]).    Patient is also asking if Dr Aline Brochure would prescribe something for the pain; states working 10 hour days has made her ankle and leg hurt a lot. If so, her [facility] in [place].

## 2022-10-21 NOTE — Telephone Encounter (Signed)
Tylenol 500 mg q6

## 2022-10-21 NOTE — Telephone Encounter (Signed)
I called her to advise she voiced understanding

## 2022-10-21 NOTE — Telephone Encounter (Signed)
Rx for brace at front desk  She is asking for something for pain
